# Patient Record
Sex: Male | Born: 1985 | Race: White | Hispanic: No | Marital: Married | Smoking: Never smoker
Health system: Southern US, Community
[De-identification: ages and names within clinical notes are randomized; demographics above are authoritative.]

## PROBLEM LIST (undated history)

## (undated) DIAGNOSIS — F4310 Post-traumatic stress disorder, unspecified: Secondary | ICD-10-CM

## (undated) DIAGNOSIS — U071 COVID-19: Secondary | ICD-10-CM

## (undated) DIAGNOSIS — T387X5A Adverse effect of androgens and anabolic congeners, initial encounter: Secondary | ICD-10-CM

## (undated) DIAGNOSIS — Z87442 Personal history of urinary calculi: Secondary | ICD-10-CM

## (undated) DIAGNOSIS — F909 Attention-deficit hyperactivity disorder, unspecified type: Secondary | ICD-10-CM

## (undated) DIAGNOSIS — K219 Gastro-esophageal reflux disease without esophagitis: Secondary | ICD-10-CM

## (undated) DIAGNOSIS — I493 Ventricular premature depolarization: Secondary | ICD-10-CM

## (undated) DIAGNOSIS — Z9989 Dependence on other enabling machines and devices: Secondary | ICD-10-CM

## (undated) DIAGNOSIS — F418 Other specified anxiety disorders: Secondary | ICD-10-CM

## (undated) DIAGNOSIS — G4733 Obstructive sleep apnea (adult) (pediatric): Secondary | ICD-10-CM

## (undated) HISTORY — DX: Post-traumatic stress disorder, unspecified: F43.10

## (undated) HISTORY — DX: Adverse effect of androgens and anabolic congeners, initial encounter: T38.7X5A

## (undated) HISTORY — DX: Obstructive sleep apnea (adult) (pediatric): G47.33

## (undated) HISTORY — PX: SHOULDER SURGERY: SHX246

## (undated) HISTORY — DX: Personal history of urinary calculi: Z87.442

## (undated) HISTORY — DX: Gastro-esophageal reflux disease without esophagitis: K21.9

## (undated) HISTORY — DX: Other specified anxiety disorders: F41.8

## (undated) HISTORY — DX: Ventricular premature depolarization: I49.3

## (undated) HISTORY — DX: COVID-19: U07.1

## (undated) HISTORY — DX: Attention-deficit hyperactivity disorder, unspecified type: F90.9

## (undated) HISTORY — DX: Dependence on other enabling machines and devices: Z99.89

---
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
## 2005-04-15 ENCOUNTER — Emergency Department (HOSPITAL_COMMUNITY): Admission: EM | Admit: 2005-04-15 | Discharge: 2005-04-16 | Payer: Self-pay | Admitting: Emergency Medicine

## 2010-04-04 ENCOUNTER — Encounter: Payer: Self-pay | Admitting: Orthopedic Surgery

## 2013-05-08 DIAGNOSIS — F900 Attention-deficit hyperactivity disorder, predominantly inattentive type: Secondary | ICD-10-CM | POA: Insufficient documentation

## 2013-06-20 ENCOUNTER — Emergency Department (HOSPITAL_BASED_OUTPATIENT_CLINIC_OR_DEPARTMENT_OTHER): Payer: Managed Care, Other (non HMO)

## 2013-06-20 ENCOUNTER — Emergency Department (HOSPITAL_BASED_OUTPATIENT_CLINIC_OR_DEPARTMENT_OTHER)
Admission: EM | Admit: 2013-06-20 | Discharge: 2013-06-20 | Disposition: A | Discharge status: Home / Self Care | Payer: Managed Care, Other (non HMO) | Attending: Emergency Medicine | Admitting: Emergency Medicine

## 2013-06-20 ENCOUNTER — Encounter (HOSPITAL_BASED_OUTPATIENT_CLINIC_OR_DEPARTMENT_OTHER): Payer: Self-pay | Admitting: Emergency Medicine

## 2013-06-20 DIAGNOSIS — S6721XA Crushing injury of right hand, initial encounter: Secondary | ICD-10-CM

## 2013-06-20 DIAGNOSIS — Y929 Unspecified place or not applicable: Secondary | ICD-10-CM | POA: Insufficient documentation

## 2013-06-20 DIAGNOSIS — IMO0002 Reserved for concepts with insufficient information to code with codable children: Secondary | ICD-10-CM | POA: Insufficient documentation

## 2013-06-20 DIAGNOSIS — Y939 Activity, unspecified: Secondary | ICD-10-CM | POA: Insufficient documentation

## 2013-06-20 DIAGNOSIS — W208XXA Other cause of strike by thrown, projected or falling object, initial encounter: Secondary | ICD-10-CM | POA: Insufficient documentation

## 2013-06-20 DIAGNOSIS — S6720XA Crushing injury of unspecified hand, initial encounter: Secondary | ICD-10-CM | POA: Insufficient documentation

## 2013-06-20 NOTE — ED Provider Notes (Signed)
CSN: [id number]     Arrival date & time 06/20/13  1734 History   First MD Initiated Contact with Patient 06/20/13 1921     Chief Complaint  Patient presents with  . Hand Injury     (Consider location/radiation/quality/duration/timing/severity/associated sxs/prior Treatment) Patient is a 28 y.o. male presenting with hand injury. The history is provided by the patient. No language interpreter was used.  Hand Injury Location:  Hand Injury: yes   Mechanism of injury: crush   Crush injury:    Mechanism: weight. Hand location:  L hand Pain details:    Quality:  Aching   Severity:  No pain   Onset quality:  Gradual   Timing:  Constant   Progression:  Worsening Chronicity:  New Handedness:  Left-handed Dislocation: no   Foreign body present:  No foreign bodies Tetanus status:  Up to date Relieved by:  Nothing Worsened by:  Nothing tried Ineffective treatments:  None tried Associated symptoms: swelling     History reviewed. No pertinent past medical history. History reviewed. No pertinent past surgical history. No family history on file. History  Substance Use Topics  . Smoking status: Never Smoker   . Smokeless tobacco: Not on file  . Alcohol Use: No    Review of Systems  All other systems reviewed and are negative.     Allergies  Review of patient's allergies indicates no known allergies.  Home Medications  No current outpatient prescriptions on file. BP 156/64  Pulse 63  Temp(Src) 98.2 F (36.8 C) (Oral)  Resp 18  Ht 6' (1.829 m)  Wt 223 lb (101.152 kg)  BMI 30.24 kg/m2  SpO2 99% Physical Exam  Nursing note and vitals reviewed. Constitutional: He is oriented to person, place, and time. He appears well-developed and well-nourished.  HENT:  Head: Normocephalic and atraumatic.  Musculoskeletal: He exhibits tenderness.  Bruised swollen right hand,  Small abrasion between 4th adn 5th finger   From  nv and ns intact  Neurological: He is alert and oriented to  person, place, and time. He has normal reflexes.  Skin: Skin is warm.  Psychiatric: He has a normal mood and affect.    ED Course  Procedures (including critical care time) Labs Review Labs Reviewed - No data to display Imaging Review Dg Hand Complete Left  06/20/2013   CLINICAL DATA:  Injured left hand.  EXAM: LEFT HAND - COMPLETE 3+ VIEW  COMPARISON:  None.  FINDINGS: The joint spaces are maintained. No acute fracture. Moderate soft tissue swelling noted along the dorsum of the metacarpal heads.  IMPRESSION: Dorsal soft tissue swelling but no acute fracture.   Electronically Signed   By: Loralie ChampagneMark  Gallerani M.D.   On: 06/20/2013 18:17     EKG Interpretation None      MDM   Final diagnoses:  Crushing injury of hand, right    Ibuprofen Ace wrap    Elson AreasLeslie K Nykeem Citro, PA-C 06/20/13 2058

## 2013-06-20 NOTE — ED Notes (Signed)
Hand cleansed with normal saline, small cute noted to anterior 1st metatarshal joint, hand wrapped with ace, ice reapplied

## 2013-06-20 NOTE — ED Provider Notes (Signed)
Medical screening examination/treatment/procedure(s) were performed by non-physician practitioner and as supervising physician I was immediately available for consultation/collaboration.   EKG Interpretation None        Dagmar HaitWilliam Malicia Blasdel, MD 06/20/13 2252

## 2013-06-20 NOTE — ED Notes (Signed)
Hand injury. Swelling and bruising to his left hand. States his friend dropped a plate on his hand. Small laceration between his 4th and 5th digits.

## 2013-06-20 NOTE — Discharge Instructions (Signed)
Crush Injury, Fingers or Toes  A crush injury to the fingers or toes means the tissues have been damaged by being squeezed (compressed). There will be bleeding into the tissues and swelling. Often, blood will collect under the skin. When this happens, the skin on the finger often dies and may slough off (shed) 1 week to 10 days later. Usually, new skin is growing underneath. If the injury has been too severe and the tissue does not survive, the damaged tissue may begin to turn black over several days.   Wounds which occur because of the crushing may be stitched (sutured) shut. However, crush injuries are more likely to become infected than other injuries. These wounds may not be closed as tightly as other types of cuts to prevent infection. Nails involved are often lost. These usually grow back over several weeks.   DIAGNOSIS  X-rays may be taken to see if there is any injury to the bones.  TREATMENT  Broken bones (fractures) may be treated with splinting, depending on the fracture. Often, no treatment is required for fractures of the last bone in the fingers or toes.  HOME CARE INSTRUCTIONS   · The crushed part should be raised (elevated) above the heart or center of the chest as much as possible for the first several days or as directed. This helps with pain and lessens swelling. Less swelling increases the chances that the crushed part will survive.  · Put ice on the injured area.  · Put ice in a plastic bag.  · Place a towel between your skin and the bag.  · Leave the ice on for 15-20 minutes, 03-04 times a day for the first 2 days.  · Only take over-the-counter or prescription medicines for pain, discomfort, or fever as directed by your caregiver.  · Use your injured part only as directed.  · Change your bandages (dressings) as directed.  · Keep all follow-up appointments as directed by your caregiver. Not keeping your appointment could result in a chronic or permanent injury, pain, and disability. If there is  any problem keeping the appointment, you must call to reschedule.  SEEK IMMEDIATE MEDICAL CARE IF:   · There is redness, swelling, or increasing pain in the wound area.  · Pus is coming from the wound.  · You have a fever.  · You notice a bad smell coming from the wound or dressing.  · The edges of the wound do not stay together after the sutures have been removed.  · You are unable to move the injured finger or toe.  MAKE SURE YOU:   · Understand these instructions.  · Will watch your condition.  · Will get help right away if you are not doing well or get worse.  Document Released: 02/28/2005 Document Revised: 05/23/2011 Document Reviewed: 07/16/2010  [employer]® Patient Information ©2014 [employer].

## 2014-08-18 DIAGNOSIS — G4733 Obstructive sleep apnea (adult) (pediatric): Secondary | ICD-10-CM | POA: Insufficient documentation

## 2014-10-09 ENCOUNTER — Emergency Department (HOSPITAL_COMMUNITY)
Admission: EM | Admit: 2014-10-09 | Discharge: 2014-10-09 | Disposition: A | Discharge status: Home / Self Care | Payer: Managed Care, Other (non HMO) | Attending: Emergency Medicine | Admitting: Emergency Medicine

## 2014-10-09 ENCOUNTER — Encounter (HOSPITAL_COMMUNITY): Payer: Self-pay | Admitting: Emergency Medicine

## 2014-10-09 DIAGNOSIS — R1013 Epigastric pain: Secondary | ICD-10-CM | POA: Diagnosis present

## 2014-10-09 DIAGNOSIS — K2970 Gastritis, unspecified, without bleeding: Secondary | ICD-10-CM | POA: Diagnosis not present

## 2014-10-09 LAB — CBC WITH DIFFERENTIAL/PLATELET
BASOS ABS: 0 10*3/uL (ref 0.0–0.1)
Basophils Relative: 1 % (ref 0–1)
EOS PCT: 4 % (ref 0–5)
Eosinophils Absolute: 0.2 10*3/uL (ref 0.0–0.7)
HCT: 39.7 % (ref 39.0–52.0)
Hemoglobin: 13.9 g/dL (ref 13.0–17.0)
LYMPHS PCT: 38 % (ref 12–46)
Lymphs Abs: 2.2 10*3/uL (ref 0.7–4.0)
MCH: 30.1 pg (ref 26.0–34.0)
MCHC: 35 g/dL (ref 30.0–36.0)
MCV: 85.9 fL (ref 78.0–100.0)
MONO ABS: 0.4 10*3/uL (ref 0.1–1.0)
Monocytes Relative: 6 % (ref 3–12)
Neutro Abs: 2.9 10*3/uL (ref 1.7–7.7)
Neutrophils Relative %: 51 % (ref 43–77)
Platelets: 190 10*3/uL (ref 150–400)
RBC: 4.62 MIL/uL (ref 4.22–5.81)
RDW: 12.8 % (ref 11.5–15.5)
WBC: 5.8 10*3/uL (ref 4.0–10.5)

## 2014-10-09 LAB — LIPASE, BLOOD: LIPASE: 28 U/L (ref 22–51)

## 2014-10-09 LAB — COMPREHENSIVE METABOLIC PANEL
ALK PHOS: 62 U/L (ref 38–126)
ALT: 18 U/L (ref 17–63)
ANION GAP: 6 (ref 5–15)
AST: 25 U/L (ref 15–41)
Albumin: 4.2 g/dL (ref 3.5–5.0)
BUN: 26 mg/dL — ABNORMAL HIGH (ref 6–20)
CO2: 27 mmol/L (ref 22–32)
Calcium: 9.3 mg/dL (ref 8.9–10.3)
Chloride: 105 mmol/L (ref 101–111)
Creatinine, Ser: 0.84 mg/dL (ref 0.61–1.24)
Glucose, Bld: 106 mg/dL — ABNORMAL HIGH (ref 65–99)
Potassium: 3.9 mmol/L (ref 3.5–5.1)
Sodium: 138 mmol/L (ref 135–145)
Total Bilirubin: 0.6 mg/dL (ref 0.3–1.2)
Total Protein: 7.2 g/dL (ref 6.5–8.1)

## 2014-10-09 MED ORDER — MORPHINE SULFATE 4 MG/ML IJ SOLN
4.0000 mg | Freq: Once | INTRAMUSCULAR | Status: AC
  Administered 2014-10-09: 4 mg via INTRAVENOUS
  Filled 2014-10-09: qty 1

## 2014-10-09 MED ORDER — ONDANSETRON HCL 4 MG/2ML IJ SOLN
4.0000 mg | Freq: Once | INTRAMUSCULAR | Status: AC
  Administered 2014-10-09: 4 mg via INTRAVENOUS
  Filled 2014-10-09: qty 2

## 2014-10-09 MED ORDER — GI COCKTAIL ~~LOC~~
30.0000 mL | Freq: Once | ORAL | Status: AC
  Administered 2014-10-09: 30 mL via ORAL
  Filled 2014-10-09: qty 30

## 2014-10-09 MED ORDER — PANTOPRAZOLE SODIUM 40 MG IV SOLR
40.0000 mg | INTRAVENOUS | Status: AC
  Administered 2014-10-09: 40 mg via INTRAVENOUS
  Filled 2014-10-09: qty 40

## 2014-10-09 MED ORDER — PANTOPRAZOLE SODIUM 20 MG PO TBEC
20.0000 mg | DELAYED_RELEASE_TABLET | Freq: Every day | ORAL | Status: DC
Start: 2014-10-09 — End: 2017-08-24

## 2014-10-09 NOTE — ED Notes (Signed)
MD at bedside. 

## 2014-10-09 NOTE — Discharge Instructions (Signed)
Take protonix daily for GERD. Refer to attached documents for more information. Follow up with [facility] for further evaluation and management of your symptoms.

## 2014-10-09 NOTE — ED Provider Notes (Signed)
Complains of epigastric pain onset March 2016. Pain is sometimes worse with eating. Also worse with stress. Nonradiating. He treated himself with times without relief. No fever. Last bowel movement yesterday, normal. Denies blood per rectum. On exam alert Glasgow Coma Score 15 abdomen normal active bowel sounds tender at epigastrium no guarding rigidity or rebound. No right upper quadrant tenderness no right lower quadrant tenderness. Suspect gastritis versus peptic ulcer disease versus GERD  Doug Sou, MD 10/09/14 1601

## 2014-10-09 NOTE — ED Notes (Signed)
Patient states that he has abdominal pain since 10-09-14 but this is an ongoing pain since March 2016.  He states that it has become worse over time. He was told by doctor in March that he could have acid reflux but he wasn't prescribed medications.

## 2014-10-09 NOTE — ED Provider Notes (Signed)
CSN: [id number]     Arrival date & time 10/09/14  0714 History   First MD Initiated Contact with Patient 10/09/14 [phone]     Chief Complaint  Patient presents with  . Abdominal Pain     (Consider location/radiation/quality/duration/timing/severity/associated sxs/prior Treatment) HPI Comments: Patient is a 29 year old male with no significant past medical history who presents with abdominal pain for the past 4 months that acutely worsened since last night. The pain is located in the epigastrium and does radiate. The pain is described as burning and severe. The pain started gradually and progressively worsened since the onset. No alleviating/aggravating factors. The patient has tried TUMS for symptoms without relief. Associated symptoms include decreased appetite. Patient denies fever, headache, NVD, chest pain, SOB, dysuria, constipation.    History reviewed. No pertinent past medical history. History reviewed. No pertinent past surgical history. History reviewed. No pertinent family history. History  Substance Use Topics  . Smoking status: Never Smoker   . Smokeless tobacco: Not on file  . Alcohol Use: No    Review of Systems  Gastrointestinal: Positive for abdominal pain.  All other systems reviewed and are negative.     Allergies  Review of patient's allergies indicates no known allergies.  Home Medications   Prior to Admission medications   Not on File   BP 133/76 mmHg  Pulse 70  Temp(Src) 97.7 F (36.5 C) (Oral)  Resp 20  SpO2 99% Physical Exam  Constitutional: He is oriented to person, place, and time. He appears well-developed and well-nourished. No distress.  HENT:  Head: Normocephalic and atraumatic.  Eyes: Conjunctivae and EOM are normal.  Neck: Normal range of motion.  Cardiovascular: Normal rate and regular rhythm.  Exam reveals no gallop and no friction rub.   No murmur heard. Pulmonary/Chest: Effort normal and breath sounds normal. He has no wheezes. He  has no rales. He exhibits no tenderness.  Abdominal: Soft. He exhibits no distension. There is tenderness. There is no rebound.  Epigastric tenderness to palpation. No other focal tenderness.   Musculoskeletal: Normal range of motion.  Neurological: He is alert and oriented to person, place, and time. Coordination normal.  Speech is goal-oriented. Moves limbs without ataxia.   Skin: Skin is warm and dry.  Psychiatric: He has a normal mood and affect. His behavior is normal.  Nursing note and vitals reviewed.   ED Course  Procedures (including critical care time) Labs Review Labs Reviewed  COMPREHENSIVE METABOLIC PANEL - Abnormal; Notable for the following:    Glucose, Bld 106 (*)    BUN 26 (*)    All other components within normal limits  CBC WITH DIFFERENTIAL/PLATELET  LIPASE, BLOOD    Imaging Review No results found.   EKG Interpretation None      MDM   Final diagnoses:  Gastritis    8:11 AM Labs pending. Patient will have protonix, GI cocktail, and zofran for symptoms. Vitals stable and patient afebrile.   10:16 AM Labs unremarkable for acute changes. Patient will be treated with Protonix and referral to GI. Patient likely has a gastric ulcer. Vitals stable and patient afebrile. No further evaluation needed at this time.     Emilia Beck, PA-C 10/09/14 1019  Doug Sou, MD 10/09/14 [phone]

## 2014-12-17 DIAGNOSIS — F321 Major depressive disorder, single episode, moderate: Secondary | ICD-10-CM | POA: Insufficient documentation

## 2014-12-17 DIAGNOSIS — F4310 Post-traumatic stress disorder, unspecified: Secondary | ICD-10-CM | POA: Insufficient documentation

## 2017-03-17 DIAGNOSIS — K219 Gastro-esophageal reflux disease without esophagitis: Secondary | ICD-10-CM | POA: Insufficient documentation

## 2017-04-17 DIAGNOSIS — R002 Palpitations: Secondary | ICD-10-CM | POA: Insufficient documentation

## 2017-08-22 ENCOUNTER — Encounter (HOSPITAL_COMMUNITY): Payer: Self-pay

## 2017-08-22 ENCOUNTER — Emergency Department (HOSPITAL_COMMUNITY)
Admission: EM | Admit: 2017-08-22 | Discharge: 2017-08-22 | Disposition: A | Discharge status: Home / Self Care | Payer: 59 | Attending: Emergency Medicine | Admitting: Emergency Medicine

## 2017-08-22 ENCOUNTER — Other Ambulatory Visit: Payer: Self-pay

## 2017-08-22 ENCOUNTER — Emergency Department (HOSPITAL_COMMUNITY): Payer: 59

## 2017-08-22 DIAGNOSIS — Z5321 Procedure and treatment not carried out due to patient leaving prior to being seen by health care provider: Secondary | ICD-10-CM | POA: Diagnosis not present

## 2017-08-22 DIAGNOSIS — R002 Palpitations: Secondary | ICD-10-CM | POA: Insufficient documentation

## 2017-08-22 LAB — I-STAT TROPONIN, ED: Troponin i, poc: 0.01 ng/mL (ref 0.00–0.08)

## 2017-08-22 LAB — CBC
HEMATOCRIT: 42.1 % (ref 39.0–52.0)
Hemoglobin: 14.9 g/dL (ref 13.0–17.0)
MCH: 30.2 pg (ref 26.0–34.0)
MCHC: 35.4 g/dL (ref 30.0–36.0)
MCV: 85.4 fL (ref 78.0–100.0)
Platelets: 234 10*3/uL (ref 150–400)
RBC: 4.93 MIL/uL (ref 4.22–5.81)
RDW: 11.6 % (ref 11.5–15.5)
WBC: 10.3 10*3/uL (ref 4.0–10.5)

## 2017-08-22 LAB — BASIC METABOLIC PANEL
ANION GAP: 10 (ref 5–15)
BUN: 13 mg/dL (ref 6–20)
CO2: 25 mmol/L (ref 22–32)
Calcium: 9.6 mg/dL (ref 8.9–10.3)
Chloride: 104 mmol/L (ref 101–111)
Creatinine, Ser: 0.83 mg/dL (ref 0.61–1.24)
GFR calc Af Amer: >60 mL/min (ref >60)
GFR calc non Af Amer: >60 mL/min (ref >60)
GLUCOSE: 89 mg/dL (ref 65–99)
POTASSIUM: 3.6 mmol/L (ref 3.5–5.1)
SODIUM: 139 mmol/L (ref 135–145)

## 2017-08-22 NOTE — ED Notes (Signed)
Pt decided to leave 

## 2017-08-22 NOTE — ED Provider Notes (Cosign Needed)
Patient placed in Quick Look pathway, seen and evaluated   Chief Complaint: Palpitations, central chest pain  HPI:   Palpitations intermittent for 6 months.  Remote history of anabolic steroid use.  Denies stimulants, coffee.  Reports central chest pain for about 1 day.  No SOB.  Not using CPAP at night.   ROS: no fever.   Physical Exam:   Gen: No distress  Neuro: Awake and Alert  Skin: Warm    Focused Exam: HR is irregular.  No diaphoresis.     Initiation of care has begun. The patient has been counseled on the process, plan, and necessity for staying for the completion/evaluation, and the remainder of the medical screening examination    Cristina GongHammond, Janett Kamath W, Cordelia Poche-C 08/22/17 1406

## 2017-08-22 NOTE — ED Triage Notes (Signed)
Pt endorses intermittent palpitations worse with physical activity or caffeine consumption. No medical hx. Pt has been seen by pcp for same and had ekg which was normal.

## 2017-08-23 ENCOUNTER — Encounter (HOSPITAL_COMMUNITY): Payer: Self-pay | Admitting: Emergency Medicine

## 2017-08-23 ENCOUNTER — Emergency Department (HOSPITAL_COMMUNITY)
Admission: EM | Admit: 2017-08-23 | Discharge: 2017-08-23 | Disposition: A | Discharge status: Home / Self Care | Payer: 59 | Attending: Emergency Medicine | Admitting: Emergency Medicine

## 2017-08-23 DIAGNOSIS — Z79899 Other long term (current) drug therapy: Secondary | ICD-10-CM | POA: Insufficient documentation

## 2017-08-23 DIAGNOSIS — I493 Ventricular premature depolarization: Secondary | ICD-10-CM | POA: Diagnosis not present

## 2017-08-23 DIAGNOSIS — R079 Chest pain, unspecified: Secondary | ICD-10-CM | POA: Diagnosis present

## 2017-08-23 LAB — BASIC METABOLIC PANEL
ANION GAP: 9 (ref 5–15)
BUN: 19 mg/dL (ref 6–20)
CHLORIDE: 105 mmol/L (ref 101–111)
CO2: 27 mmol/L (ref 22–32)
Calcium: 9.5 mg/dL (ref 8.9–10.3)
Creatinine, Ser: 0.85 mg/dL (ref 0.61–1.24)
Glucose, Bld: 96 mg/dL (ref 65–99)
POTASSIUM: 3.6 mmol/L (ref 3.5–5.1)
SODIUM: 141 mmol/L (ref 135–145)

## 2017-08-23 LAB — I-STAT TROPONIN, ED
TROPONIN I, POC: 0 ng/mL (ref 0.00–0.08)
Troponin i, poc: 0 ng/mL (ref 0.00–0.08)

## 2017-08-23 LAB — CBC
HEMATOCRIT: 45.4 % (ref 39.0–52.0)
HEMOGLOBIN: 16 g/dL (ref 13.0–17.0)
MCH: 31.1 pg (ref 26.0–34.0)
MCHC: 35.2 g/dL (ref 30.0–36.0)
MCV: 88.3 fL (ref 78.0–100.0)
Platelets: 263 10*3/uL (ref 150–400)
RBC: 5.14 MIL/uL (ref 4.22–5.81)
RDW: 11.9 % (ref 11.5–15.5)
WBC: 10.2 10*3/uL (ref 4.0–10.5)

## 2017-08-23 MED ORDER — METOPROLOL SUCCINATE ER 25 MG PO TB24: 25.0000 mg | ORAL_TABLET | Freq: Every day | ORAL | 0 refills | Status: DC

## 2017-08-23 NOTE — ED Provider Notes (Signed)
[name]   CSN: [id number] Arrival date & time: 08/23/17  0247     History   Chief Complaint Chief Complaint  Patient presents with  . Chest Pain    HPI Gregory Wall is a 32 y.o. male.  Chief complaint is irregular heartbeat  HPI: 32 year old male.  Started having irregular heartbeats around the end of last year.  States that one point a "heart nursing "felt his pulse and felt he was having PVCs.  This has happened intermittently.    He feels like his heart is "shifting" in his chest.  This happens intermittently but at times happens several times per minute.  When it happens it keeps him from sleep.  It happens during activity at times.  He does not have exertional syncope, chest pain, or dyspnea.  No swelling.  No CHF symptoms.  States that he is worried and concerned about it stating that in the past he used to be a "almost Sports coach".  And did anabolic steroids in high doses for a series of years.  He has since researched it and found that it can "have some effects on my heart".  History reviewed. No pertinent past medical history.  There are no active problems to display for this patient.   History reviewed. No pertinent surgical history.      Home Medications    Prior to Admission medications   Medication Sig Start Date End Date Taking? Authorizing Provider  metoprolol succinate (TOPROL-XL) 25 MG 24 hr tablet Take 1 tablet (25 mg total) by mouth daily. 08/23/17   Rolland Porter, MD  omeprazole (PRILOSEC) 40 MG capsule Take 40 mg by mouth daily. 07/04/17   [provider]  pantoprazole (PROTONIX) 20 MG tablet Take 1 tablet (20 mg total) by mouth daily. Patient not taking: Reported on 08/22/2017 10/09/14   Emilia Beck, PA-C    Family History No family history on file.  Social History Social History   Tobacco Use  . Smoking status: Never Smoker  Substance Use Topics  . Alcohol  use: No  . Drug use: Yes    Types: Marijuana    Comment: occ weed     Allergies   Patient has no known allergies.   Review of Systems Review of Systems  Constitutional: Negative for appetite change, chills, diaphoresis, fatigue and fever.  HENT: Negative for mouth sores, sore throat and trouble swallowing.   Eyes: Negative for visual disturbance.  Respiratory: Negative for cough, chest tightness, shortness of breath and wheezing.   Cardiovascular: Positive for palpitations. Negative for chest pain.  Gastrointestinal: Negative for abdominal distention, abdominal pain, diarrhea, nausea and vomiting.  Endocrine: Negative for polydipsia, polyphagia and polyuria.  Genitourinary: Negative for dysuria, frequency and hematuria.  Musculoskeletal: Negative for gait problem.  Skin: Negative for color change, pallor and rash.  Neurological: Negative for dizziness, syncope, light-headedness and headaches.  Hematological: Does not bruise/bleed easily.  Psychiatric/Behavioral: Negative for behavioral problems and confusion.     Physical Exam Updated Vital Signs BP (!) 155/103   Pulse 66   Temp 98.1 F (36.7 C) (Oral)   Resp 16   Ht 6\' 2"  (1.88 m)   Wt 106.6 kg (235 lb)   SpO2 99%   BMI 30.17 kg/m   Physical Exam  Constitutional: He is oriented to person, place, and time. He appears well-developed and well-nourished. No distress.  HENT:  Head: Normocephalic.  Eyes: Pupils are equal, round, and reactive  to light. Conjunctivae are normal. No scleral icterus.  Neck: Normal range of motion. Neck supple. No thyromegaly present.  Cardiovascular: Normal rate and regular rhythm. Exam reveals no gallop and no friction rub.  No murmur heard. Sinus rhythm.  Does have occasional PVCs as I examined him.  These do reproduce his symptoms.  No runs of tachycardia.  Does have a sinus arrhythmia.  No murmurs.  No peripheral edema  Pulmonary/Chest: Effort normal and breath sounds normal. No  respiratory distress. He has no wheezes. He has no rales.  Abdominal: Soft. Bowel sounds are normal. He exhibits no distension. There is no tenderness. There is no rebound.  Musculoskeletal: Normal range of motion.  Neurological: He is alert and oriented to person, place, and time.  Skin: Skin is warm and dry. No rash noted.  Psychiatric: He has a normal mood and affect. His behavior is normal.     ED Treatments / Results  Labs (all labs ordered are listed, but only abnormal results are displayed) Labs Reviewed  BASIC METABOLIC PANEL  CBC  I-STAT TROPONIN, ED  I-STAT TROPONIN, ED    EKG EKG Interpretation  Date/Time:  Wednesday August 23 2017 02:56:43 EDT Ventricular Rate:  73 PR Interval:  164 QRS Duration: 94 QT Interval:  398 QTC Calculation: 438 R Axis:   56 Text Interpretation:  Normal sinus rhythm with sinus arrhythmia Normal ECG Confirmed by Rolland PorterJames, Thamara Leger ([id number]) on 08/23/2017 9:22:34 AM   Radiology Dg Chest 2 View  Result Date: 08/22/2017 CLINICAL DATA:  Heart palpitations and center chest pain/discomfort over the past 8 months. Pt states he occasionally becomes SOB during strong palpitations. Hx of asthma. Nonsmoker. EXAM: CHEST - 2 VIEW COMPARISON:  None. FINDINGS: The heart size and mediastinal contours are within normal limits. Both lungs are clear. The visualized skeletal structures are unremarkable. IMPRESSION: No active cardiopulmonary disease. Electronically Signed   By: Gaylyn RongWalter  Liebkemann M.D.   On: 08/22/2017 15:08    Procedures Procedures (including critical care time)  Medications Ordered in ED Medications - No data to display   Initial Impression / Assessment and Plan / ED Course  I have reviewed the triage vital signs and the nursing notes.  Pertinent labs & imaging results that were available during my care of the patient were reviewed by me and considered in my medical decision making (see chart for details).    EKG  Interpretation  Date/Time:  Wednesday August 23 2017 02:56:43 EDT Ventricular Rate:  73 PR Interval:  164 QRS Duration: 94 QT Interval:  398 QTC Calculation: 438 R Axis:   56 Text Interpretation:  Normal sinus rhythm with sinus arrhythmia Normal ECG Confirmed by Rolland PorterJames, Devlyn Retter ([id number]) on 08/23/2017 9:22:34 AM   Reassuring labs.  Does not have voltage criteria for LVH.  Does not have cardiomegaly on chest x-ray.  Without murmurs and without enlargement of his heart very likely will have a structurally normal heart.  However, with his history of asthmatic steroid use will refer to cardiology.  May require echocardiogram, and Holter monitoring.  He is symptomatic with his PVCs and has difficulty sleeping.  We will give him low-dose 25 mg Toprol to take every afternoon to help decrease his number of symptoms and hopefully improve his quality of sleep and function until cardiology follow-up.  Meanwhile avoid decongestants, sinus medications, alcohol, tobacco, caffeine.   Final Clinical Impressions(s) / ED Diagnoses   Final diagnoses:  PVC's (premature ventricular contractions)    ED Discharge Orders  Ordered    metoprolol succinate (TOPROL-XL) 25 MG 24 hr tablet  Daily     08/23/17 0954       Rolland Porter, MD 08/23/17 1001

## 2017-08-23 NOTE — Discharge Instructions (Addendum)
Take Toprol (Beta blocker to reduce number of irregular beats) every evening. Avoid alcohol, caffeine, tobacco, or sinus meds/decongestants.  Call Dr. Cardiology office to make out patient appointment.

## 2017-08-23 NOTE — ED Triage Notes (Signed)
Pt was awaken w/ chest palpitations again tonight, was seen for the same yesterday.  Is suppose to make cards appointment but no appointment has been made yet.  Pt also feeling nauseas at this time.

## 2017-08-24 ENCOUNTER — Encounter: Payer: Self-pay | Admitting: Cardiology

## 2017-08-24 ENCOUNTER — Ambulatory Visit: Payer: 59 | Admitting: Cardiology

## 2017-08-24 VITALS — BP 113/75 | HR 69 | Ht 71.0 in | Wt 233.4 lb

## 2017-08-24 DIAGNOSIS — R002 Palpitations: Secondary | ICD-10-CM | POA: Diagnosis not present

## 2017-08-24 DIAGNOSIS — I493 Ventricular premature depolarization: Secondary | ICD-10-CM | POA: Insufficient documentation

## 2017-08-24 NOTE — Patient Instructions (Addendum)
No medication changes at present   Schedule at [facility] Your physician has recommended that you wear an event monitor - 14 DAYS. Event monitors are medical devices that record the heart's electrical activity. Doctors most often us these monitors to diagnose arrhythmias. Arrhythmias are problems with the speed or rhythm of the heartbeat. The monitor is a small, portable device. You can wear one while you do your normal daily activities. This is usually used to diagnose what is causing palpitations/syncope (passing out).    Your physician recommends that you schedule a follow-up appointment in 1 -2 MONTHS WITH DR HARDING.

## 2017-08-24 NOTE — Progress Notes (Signed)
PCP: Rosemary HolmsMoreira, Niall A, PA-C  [facility]) Betsey AmenMatthew E Levy, MD (Endocrinology)   Clinic Note: Chief Complaint  Patient presents with  . New Patient (Initial Visit)    Frequent palpitations    HPI: Gregory Wall is a 32 y.o. male who is being seen today for the Hospital follow-up evaluation of Palpitations.  Initial request from  Rosemary HolmsMoreira, Niall A, PA-C, however he has had a emergency room visit since the initial PCP visit.  Gregory Wall was last seen on 04/17/17 by Misty StanleyNiall Moreira, PA-C [facility]) -- "Patient reports that he experienced a week straight of increased amounts of palpitations. Patient states that his energy level has decreased during the week of symptoms. Patient reports that he checks his pulse during episodes and notes that he feels his heart skipping a beat every 2-14 beats'.Still able to perform CV exercise routinely with no issues. Patient notes increased symptoms after changing positions from standing to laying down. Patient denies any lightheadedness or syncope. Notes an increased "pressure" in his head occasionally."  [facility] Hospital[facility]   [facility]: Irregular Heartbeats (was told by a RN acquaintance that he was having PVCs. ->  Was noted to have PVCs on telemetry. -->  Recommended starting low-dose Toprol 25 mg in the afternoon.  Studies Personally Reviewed - (if available, images/films reviewed: From Epic Chart or Care Everywhere)   Interval History: Gregory Wall presents here today for frequent irregular heartbeats.  Basically since February has been noticing increasing episodes where he feels just frequent irregular heartbeats.  He will have associated chest discomfort were relatively jolting sensation across his chest lasting a few seconds.  This can happen off and on every 2 to 5 minutes for an hour at a time.  He can sometimes go a week without having an episode and then can go to 3 days where they  are happening all the time.  He says that the most the time they occur at night and it woke him up from sleep would like to in the morning on occasion, but they can also occur when he is out and about doing things.  He is not necessarily associated with it at all with exertion. He used to be an avid coffee drinker but, but has cut down significantly since the onset of the symptoms to down to 1 cup of coffee a day and really has not noticed that much of a difference.  besides feeling a little discomfort in his chest and some disc uneasy feeling, he occasionally will feel lightheaded but has not had any syncope or near syncopal type symptoms. Interestingly, he is a former heavy weightlifter, and acknowledges that he used anabolic steroids and T3 supplementation for several years in is early 6020s.  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  No palpitations.  Remainder of cardiac review of symptoms are essentially negative: No chest pain or shortness of breath with exertion. No PND, orthopnea or edema.  No syncope/near syncope, or TIA/amaurosis fugax symptoms. No claudication.  ROS: A comprehensive was performed. Review of Systems  Constitutional: Negative for malaise/fatigue and weight loss.  HENT: Negative for congestion and nosebleeds.   Respiratory: Negative for shortness of breath.   Gastrointestinal: Negative for blood in stool, heartburn and melena.  Genitourinary: Negative for hematuria.  Musculoskeletal: Negative.   Psychiatric/Behavioral: Negative.   All other systems reviewed  and are negative.   I have reviewed and (if needed) personally updated the patient's problem list, medications, allergies, past medical and surgical history, social and family history.   Past Medical History:  Diagnosis Date  . Adult ADHD   . Adverse effect of anabolic steroid    Longtime use of anabolic steroids in early 20s    . Depression with anxiety   . GERD (gastroesophageal reflux disease)   . History of nephrolithiasis   . OSA on CPAP   . 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 SHOULDER SURGERY     abscess    Current Meds  Medication Sig  . b complex vitamins tablet Take 1 tablet by mouth daily.  . metoprolol succinate (TOPROL-XL) 25 MG 24 hr tablet Take 1 tablet (25 mg total) by mouth daily.  . Omega-3 Fatty Acids (FISH OIL PO) Take by mouth daily.  Marland Kitchen omeprazole (PRILOSEC) 40 MG capsule Take 40 mg by mouth daily.  . [DISCONTINUED] pantoprazole (PROTONIX) 20 MG tablet Take 1 tablet (20 mg total) by mouth daily.  Toprol 25 mg daily.  Prilosec 40 mg daily.  No Known Allergies  Social History   Tobacco Use  . Smoking status: Never Smoker  . Smokeless tobacco: Never Used  Substance Use Topics  . Alcohol use: Yes    Alcohol/week: 2.4 oz    Types: 2 Glasses of wine, 2 Cans of beer per week  . Drug use: Yes    Types: Marijuana    Comment: occ weed   Social History   Social History Narrative  . Not on file    family history includes Atrial fibrillation in his maternal grandmother; CVA in his maternal grandmother and maternal uncle; Heart attack in his paternal aunt and paternal grandfather; Heart attack (age of onset: 61) in his maternal grandfather; Hernia in his mother; Hypertension in his father; Stroke in his maternal grandfather. - Afib, CVA  Wt Readings from Last 3 Encounters:  08/24/17 233 lb 6.4 oz (105.9 kg)  08/23/17 235 lb (106.6 kg)  08/22/17 235 lb (106.6 kg)    PHYSICAL EXAM BP 113/75   Pulse 69   Ht 5\' 11"  (1.803 m)   Wt 233 lb 6.4 oz (105.9 kg)   BMI 32.55 kg/m  Physical Exam  Constitutional: He is oriented to person, place, and time. He appears well-developed and well-nourished. No distress.   Healthy-appearing.  Well-groomed  HENT:  Head: Normocephalic and atraumatic.  Mouth/Throat: No oropharyngeal exudate.  Eyes: Pupils are equal, round, and reactive to light. Conjunctivae are normal. No scleral icterus.  Neck: Normal range of motion. Neck supple. No hepatojugular reflux and no JVD present. Carotid bruit is not present. No thyromegaly present.  Cardiovascular: Normal rate, regular rhythm, normal heart sounds, intact distal pulses and normal pulses.  No extrasystoles are present. PMI is not displaced. Exam reveals no gallop and no friction rub.  No murmur heard. Pulmonary/Chest: Effort normal and breath sounds normal. No respiratory distress. He has no wheezes. He has no rales.  Abdominal: Soft. Bowel sounds are normal. He exhibits no distension. There is no tenderness. There is no rebound.  No HSM  Musculoskeletal: Normal range of motion. He exhibits no edema.  Lymphadenopathy:    He has no cervical adenopathy.  Neurological: He is alert and oriented to person, place, and time.  No cranial nerve deficit.  Skin: Skin is warm and dry. No rash noted. No erythema.  Psychiatric: He has a normal mood and affect. His behavior is normal. Judgment and thought content normal.  Vitals reviewed.     Adult ECG Report -from June 11 ER visit Sinus rhythm, rate 69.  Also sinus arrhythmia.  But otherwise normal EKG.  Another EKG did have PVCs noted.  Other studies Reviewed: Additional studies/ records that were reviewed today include:  Recent Labs:    (Care Everywhere) "cholesterol still a bit high - but coming down notably since stopping steroids"  Jan 2019: TC 187, TG 58, HDL 47, LDL 128.  (Previously LDL had been 156); TSH 1.59.  BUN/creatinine 17/0.5.  Potassium 4.3.  Normal LFTs.  ASSESSMENT / PLAN: Problem List Items Addressed This Visit    Intermittent palpitations - Primary    Significant episodes of palpitations that are occurring with enough frequency that probably does not  need a month-long monitor, but not enough to simply do a 1 or 2-day monitor. Plan: 14-day event monitor. For the first week I would like for him to be not taking the beta-blocker, but  I would like him to start the beta-blocker (Toprol 25 mg daily) for the second week.  Depending on the percentage of PVCs and symptoms, we may consider follow-up echocardiogram, but for now we will simply monitor.      Relevant Orders   CARDIAC EVENT MONITOR       I spent a total of 30 minutes with the patient and chart review. >  50% of the time was spent in direct patient consultation.   Current medicines are reviewed at length with the patient today.  (+/- concerns) = has not yet started metoprolol ordered in ER.  Does not know when to start. The following changes have been made:  See below  Patient Instructions  No medication changes at present     Schedule at [facility] [address] Your physician has recommended that you wear an event monitor - 14 DAYS. Event monitors are medical devices that record the heart's electrical activity. Doctors most often [place] these monitors to diagnose arrhythmias. Arrhythmias are problems with the speed or rhythm of the heartbeat. The monitor is a small, portable device. You can wear one while you do your normal daily activities. This is usually used to diagnose what is causing palpitations/syncope (passing out).    Your physician recommends that you schedule a follow-up appointment in 1 -2 MONTHS WITH DR Cowen Pesqueira.     Studies Ordered:   Orders Placed This Encounter  Procedures  . CARDIAC EVENT MONITOR      Bryan Lemma, M.D., M.S. Interventional Cardiologist   Pager # [phone] Phone # [phone] [address] [place], [place] [address]   Thank you for choosing Heartcare at  [facility]!!

## 2017-08-25 ENCOUNTER — Encounter: Payer: Self-pay | Admitting: Cardiology

## 2017-08-25 NOTE — Assessment & Plan Note (Signed)
Significant episodes of palpitations that are occurring with enough frequency that probably does not need a month-long monitor, but not enough to simply do a 1 or 2-day monitor. Plan: 14-day event monitor. For the first week I would like for him to be not taking the beta-blocker, but  I would like him to start the beta-blocker (Toprol 25 mg daily) for the second week.  Depending on the percentage of PVCs and symptoms, we may consider follow-up echocardiogram, but for now we will simply monitor.

## 2017-09-04 ENCOUNTER — Ambulatory Visit (INDEPENDENT_AMBULATORY_CARE_PROVIDER_SITE_OTHER): Payer: 59

## 2017-09-04 DIAGNOSIS — R002 Palpitations: Secondary | ICD-10-CM

## 2017-10-13 ENCOUNTER — Ambulatory Visit: Payer: 59 | Admitting: Cardiology

## 2017-10-13 ENCOUNTER — Encounter: Payer: Self-pay | Admitting: Cardiology

## 2017-10-13 VITALS — BP 130/87 | HR 74 | Ht 71.0 in | Wt 238.4 lb

## 2017-10-13 DIAGNOSIS — I493 Ventricular premature depolarization: Secondary | ICD-10-CM

## 2017-10-13 MED ORDER — METOPROLOL SUCCINATE ER 25 MG PO TB24: 25.0000 mg | ORAL_TABLET | Freq: Every day | ORAL | 3 refills | Status: DC

## 2017-10-13 NOTE — Patient Instructions (Signed)
Your physician wants you to follow-up in: 6 months with Dr. Herbie BaltimoreHarding. You will receive a reminder letter in the mail two months in advance. If you don't receive a letter, please call our office to schedule the follow-up appointment.  Please continue metoprolol succinate 25mg  daily. You can take additional tablet daily as needed.

## 2017-10-13 NOTE — Progress Notes (Signed)
PCP: Gregory Wall  [facility]) Gregory Amen, MD (Endocrinology)  Clinic Note: Chief Complaint  Patient presents with  . Follow-up    Palpitations monitor result -     HPI: Gregory Wall is a 32 y.o. male who is being seen today for the Hospital follow-up evaluation of Palpitations.   Initial request from  Gregory Wall, however he has had a emergency room visit since the initial PCP visit. -- "Patient reports that he experienced a week straight of increased amounts of palpitations. Patient states that his energy level has decreased during the week of symptoms. Patient reports that he checks his pulse during episodes and notes that he feels his heart skipping a beat every 2-14 beats'.Still able to perform CV exercise routinely with no issues. Patient notes increased symptoms after changing positions from standing to laying down. Patient denies any lightheadedness or syncope. Notes an increased "pressure" in his head occasionally." [facility] ER 08/22/2017: Irregular Heartbeats (was told by a RN acquaintance that he was having PVCs. ->  Was noted to have PVCs on telemetry. -->  Recommended starting low-dose Toprol 25 mg in the afternoon.  Gregory Wall was seen in initial consultation on June 13.  We evaluated with a cardiac event monitor noted below.  Recent Hospitalizations:   None since initial visit  Studies Personally Reviewed - (if available, images/films reviewed: From Epic Chart or Care Everywhere)  CARDIAC EVENT MONITOR 08/2017: NORMAL Monitor - Rare (~4% PVCs, minimal PACs) premature beats & NO abnormal rhythms.  Interval History: Gregory Wall presents here today to discuss his monitor results.  He notes that in general his palpitations have improved & he definitely has figured out several triggers such as spicy foods, caffeine, lack of sleep, etc.  He does not notice these episodes during exercise, but may notice during intercourse.  No  chest pain or dyspnea.  No dizziness, lightheadedness, syncope or near syncope.   Denies any TIA or amaurosis fugax symptoms.  No resting or exertional chest pain/dyspnea.  No PND, orthopnea or edema.  No claudication.    He basically having adjusted his diet and cutting down caffeine, his symptoms have abated.  He does note that the beta-blocker has made better.  ROS: A comprehensive was performed. Review of Systems  Constitutional: Negative for malaise/fatigue and weight loss.  HENT: Negative for congestion and nosebleeds.   Respiratory: Negative for shortness of breath.   Gastrointestinal: Negative for blood in stool, heartburn and melena.  Genitourinary: Negative for hematuria.  Musculoskeletal: Negative.   Neurological: Negative for dizziness and weakness.  Psychiatric/Behavioral: Negative.   All other systems reviewed and are negative.   I have reviewed and (if needed) personally updated the patient's problem list, medications, allergies, past medical and surgical history, social and family history.   Past Medical History:  Diagnosis Date  . Adult ADHD   . Adverse effect of anabolic steroid    Longtime use of anabolic steroids in early 20s  . Depression with anxiety   . GERD (gastroesophageal reflux disease)   . History of nephrolithiasis   . OSA on CPAP   . 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 SHOULDER SURGERY     abscess    Current Meds  Medication Sig  . b complex vitamins tablet Take 1  tablet by mouth daily.  . metoprolol succinate (TOPROL-XL) 25 MG 24 hr tablet Take 1 tablet (25 mg total) by mouth daily. Take additional tablet daily as needed for palpitations/PVCs  . Omega-3 Fatty Acids (FISH OIL PO) Take by mouth daily.  Marland Kitchen. omeprazole (PRILOSEC) 40 MG capsule Take 40 mg by mouth daily.  Marland Kitchen. OVER THE COUNTER  MEDICATION as directed. Vacopa for ADD  . [DISCONTINUED] metoprolol succinate (TOPROL-XL) 25 MG 24 hr tablet Take 1 tablet (25 mg total) by mouth daily.    No Known Allergies  Social History   Tobacco Use  . Smoking status: Never Smoker  . Smokeless tobacco: Never Used  Substance Use Topics  . Alcohol use: Yes    Alcohol/week: 2.4 oz    Types: 2 Glasses of wine, 2 Cans of beer per week  . Drug use: Yes    Types: Marijuana    Comment: occ weed   Social History   Social History Narrative  . Not on file    family history includes Atrial fibrillation in his maternal grandmother; CVA in his maternal grandmother and maternal uncle; Heart attack in his paternal aunt and paternal grandfather; Heart attack (age of onset: 7665) in his maternal grandfather; Hernia in his mother; Hypertension in his father; Stroke in his maternal grandfather. - Afib, CVA  Wt Readings from Last 3 Encounters:  10/13/17 238 lb 6.4 oz (108.1 kg)  08/24/17 233 lb 6.4 oz (105.9 kg)  08/23/17 235 lb (106.6 kg)    PHYSICAL EXAM BP 130/87   Pulse 74   Ht 5\' 11"  (1.803 m)   Wt 238 lb 6.4 oz (108.1 kg)   BMI 33.25 kg/m  Physical Exam  Constitutional: He is oriented to person, place, and time. He appears well-developed and well-nourished. No distress.  Healthy-appearing.  Well-groomed  HENT:  Head: Normocephalic and atraumatic.  Mouth/Throat: No oropharyngeal exudate.  Eyes: Pupils are equal, round, and reactive to light. Conjunctivae are normal. No scleral icterus.  Neck: Normal range of motion. Neck supple. No hepatojugular reflux and no JVD present. Carotid bruit is not present.  Cardiovascular: Normal rate, regular rhythm, normal heart sounds, intact distal pulses and normal pulses.  No extrasystoles (None) are present. PMI is not displaced. Exam reveals no gallop and no friction rub.  No murmur heard. Pulmonary/Chest: Effort normal and breath sounds normal. No respiratory distress. He has no wheezes. He  has no rales.  Musculoskeletal: Normal range of motion. He exhibits no edema.  Neurological: He is alert and oriented to person, place, and time. No cranial nerve deficit.  Psychiatric: He has a normal mood and affect. His behavior is normal. Judgment and thought content normal.  Vitals reviewed.     Adult ECG Report -from June 11 ER visit Sinus rhythm, rate 69.  Also sinus arrhythmia.  But otherwise normal EKG.  Another EKG did have PVCs noted.  Other studies Reviewed: Additional studies/ records that were reviewed today include:  Recent Labs:    No new l abs since last visit  (Care Everywhere) "cholesterol still a bit high - but coming down notably since stopping steroids"  Jan 2019: TC 187, TG 58, HDL 47, LDL 128.  (Previously LDL had been 156); TSH 1.59.  BUN/creatinine 17/0.5.  Potassium 4.3.  Normal LFTs.  ASSESSMENT / PLAN: Problem List Items Addressed This Visit    Symptomatic PVCs - Primary (Chronic)    He does have significant symptoms do not have a significant amount of PVCs on monitor.  Symptoms are improved with Toprol and adjusting his diet.  He mentions triggers.  Recently discussed avoiding triggers.  Also discussed adequate hydration since he notes them after exercise. Plan: Continue Toprol  20 mg daily with ability to take an extra dose or half dose as needed for bad spells.  As long as he is not having any of these palpitation episodes with exertion, I think we are okay holding off doing an ischemic evaluation.      Relevant Medications   metoprolol succinate (TOPROL-XL) 25 MG 24 hr tablet      I spent a total of 15-20 minutes with the patient and chart review. >  50% of the time was spent in direct patient consultation.   Current medicines are reviewed at length with the patient today.  (+/- concerns) =  Symptoms better on Toprol  the following changes have been made:  See below  Patient Instructions  Your physician wants you to follow-up in: 6 months with  Dr. Herbie Baltimore. You will receive a reminder letter in the mail two months in advance. If you don't receive a letter, please call our office to schedule the follow-up appointment.  Please continue metoprolol succinate 25mg  daily. You can take additional tablet daily as needed.      Studies Ordered:   No orders of the defined types were placed in this encounter.     Bryan Lemma, M.D., M.S. Interventional Cardiologist   Pager # [phone] Phone # [phone] [address] [place], [place] [address]   Thank you for choosing Heartcare at [facility]!!

## 2017-10-13 NOTE — Assessment & Plan Note (Signed)
He does have significant symptoms do not have a significant amount of PVCs on monitor.  Symptoms are improved with Toprol and adjusting his diet.  He mentions triggers.  Recently discussed avoiding triggers.  Also discussed adequate hydration since he notes them after exercise. Plan: Continue Toprol  20 mg daily with ability to take an extra dose or half dose as needed for bad spells.  As long as he is not having any of these palpitation episodes with exertion, I think we are okay holding off doing an ischemic evaluation.

## 2017-11-15 ENCOUNTER — Encounter: Payer: Self-pay | Admitting: *Deleted

## 2018-03-27 ENCOUNTER — Other Ambulatory Visit: Payer: Self-pay

## 2018-03-27 ENCOUNTER — Encounter (HOSPITAL_COMMUNITY): Payer: Self-pay | Admitting: Emergency Medicine

## 2018-03-27 ENCOUNTER — Emergency Department (HOSPITAL_COMMUNITY)
Admission: EM | Admit: 2018-03-27 | Discharge: 2018-03-27 | Disposition: A | Discharge status: Home / Self Care | Payer: 59 | Attending: Emergency Medicine | Admitting: Emergency Medicine

## 2018-03-27 ENCOUNTER — Emergency Department (HOSPITAL_COMMUNITY): Payer: 59

## 2018-03-27 DIAGNOSIS — F909 Attention-deficit hyperactivity disorder, unspecified type: Secondary | ICD-10-CM | POA: Diagnosis not present

## 2018-03-27 DIAGNOSIS — R0789 Other chest pain: Secondary | ICD-10-CM | POA: Diagnosis not present

## 2018-03-27 DIAGNOSIS — Z79899 Other long term (current) drug therapy: Secondary | ICD-10-CM | POA: Diagnosis not present

## 2018-03-27 LAB — BASIC METABOLIC PANEL
Anion gap: 8 (ref 5–15)
BUN: 13 mg/dL (ref 6–20)
CALCIUM: 9.5 mg/dL (ref 8.9–10.3)
CO2: 25 mmol/L (ref 22–32)
Chloride: 107 mmol/L (ref 98–111)
Creatinine, Ser: 0.83 mg/dL (ref 0.61–1.24)
GFR calc Af Amer: >60 mL/min (ref >60)
GLUCOSE: 99 mg/dL (ref 70–99)
POTASSIUM: 4 mmol/L (ref 3.5–5.1)
SODIUM: 140 mmol/L (ref 135–145)

## 2018-03-27 LAB — CBC
HCT: 43.2 % (ref 39.0–52.0)
HEMOGLOBIN: 14.7 g/dL (ref 13.0–17.0)
MCH: 29.6 pg (ref 26.0–34.0)
MCHC: 34 g/dL (ref 30.0–36.0)
MCV: 86.9 fL (ref 80.0–100.0)
PLATELETS: 213 10*3/uL (ref 150–400)
RBC: 4.97 MIL/uL (ref 4.22–5.81)
RDW: 11.5 % (ref 11.5–15.5)
WBC: 7.2 10*3/uL (ref 4.0–10.5)
nRBC: 0 % (ref 0.0–0.2)

## 2018-03-27 LAB — I-STAT TROPONIN, ED: TROPONIN I, POC: 0 ng/mL (ref 0.00–0.08)

## 2018-03-27 NOTE — ED Triage Notes (Signed)
Pt presents with chest pain to the center of chest, nausea, and scapula pain. He reports these symptoms are consistent with what's been going on, has an appointment Dr. Herbie Baltimore in the morning.  Has been out of his metoprolol since Nov. Hx of aggressive PVC

## 2018-03-27 NOTE — ED Provider Notes (Signed)
[facility] EMERGENCY DEPARTMENT Provider Note   CSN: [id number] Arrival date & time: 03/27/18  1121     History   Chief Complaint Chief Complaint  Patient presents with  . Chest Pain    HPI Ulices Maack is a 33 y.o. male.  33 year old male patient with prior medical history as detailed below presents for evaluation of recurrent chest discomfort.  Patient has previously been seen by cardiology for same and similar complaint.  He has been diagnosed with symptomatic PVCs.  Of note, patient has a follow-up appointment with Dr. Herbie Baltimore of cardiology tomorrow.  Patient reports that over the weekend he had worsening symptoms of palpitations and vague chest discomfort.  This was persistent through the course of Saturday and Sunday.  He arrives today for reassurance that there is no critical illness present.  Patient without current chest pain or shortness of breath.  He denies associated fever, nausea, vomiting, diaphoresis, or other acute complaint.  The history is provided by the patient and medical records.  Chest Pain  Chest pain location: Diffuse anterior chest wall. Pain quality: aching   Pain radiates to:  Does not radiate Pain severity:  No pain Onset quality:  Gradual Duration: Longstanding. Timing:  Intermittent Progression:  Waxing and waning Chronicity:  New Relieved by:  Nothing Worsened by:  Nothing Ineffective treatments:  None tried   Past Medical History:  Diagnosis Date  . Adult ADHD   . Adverse effect of anabolic steroid    Longtime use of anabolic steroids in early 20s  . Depression with anxiety   . GERD (gastroesophageal reflux disease)   . History of nephrolithiasis   . OSA on CPAP   . PTSD (post-traumatic stress disorder)     Patient Active Problem List   Diagnosis Date Noted  . 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 SHOULDER SURGERY     abscess        Home Medications     Prior to Admission medications   Medication Sig Start Date End Date Taking? Authorizing Provider  b complex vitamins tablet Take 1 tablet by mouth daily.    [provider]  metoprolol succinate (TOPROL-XL) 25 MG 24 hr tablet Take 1 tablet (25 mg total) by mouth daily. Take additional tablet daily as needed for palpitations/PVCs 10/13/17   Marykay Lex, MD  Omega-3 Fatty Acids (FISH OIL PO) Take by mouth daily.    [provider]  omeprazole (PRILOSEC) 40 MG capsule Take 40 mg by mouth daily. 07/04/17   [provider]  OVER THE COUNTER MEDICATION as directed. Bacopa for ADD    [provider]    Family History Family History  Problem Relation Age of Onset  . Hernia Mother        severaL sGX  . Hypertension Father   . Atrial fibrillation Maternal Grandmother        s/p PPM  . CVA Maternal Grandmother   . Stroke Maternal Grandfather   . Heart attack Maternal Grandfather 65  . Heart attack Paternal Grandfather        pacemaker  . Heart attack Paternal Aunt        late 11s - early 10s  . CVA Maternal Uncle     Social History Social History   Tobacco Use  . Smoking status: Never Smoker  . Smokeless tobacco: Never Used  Substance Use Topics  . Alcohol use: Yes  Alcohol/week: 4.0 standard drinks    Types: 2 Glasses of wine, 2 Cans of beer per week  . Drug use: Yes    Types: Marijuana    Comment: occ weed     Allergies   Patient has no known allergies.   Review of Systems Review of Systems  Cardiovascular: Positive for chest pain.  All other systems reviewed and are negative.    Physical Exam Updated Vital Signs BP (!) 130/99 (BP Location: Left Arm)   Pulse 70   Temp 98.6 F (37 C) (Oral)   Resp 15   Ht 6' (1.829 m)   Wt 107 kg   SpO2 99%   BMI 32.01 kg/m   Physical Exam Vitals signs and nursing note reviewed.  Constitutional:      General: He is not in acute distress.    Appearance: He is well-developed.   HENT:     Head: Normocephalic and atraumatic.  Eyes:     Conjunctiva/sclera: Conjunctivae normal.     Pupils: Pupils are equal, round, and reactive to light.  Neck:     Musculoskeletal: Normal range of motion and neck supple.  Cardiovascular:     Rate and Rhythm: Normal rate and regular rhythm.     Heart sounds: Normal heart sounds.  Pulmonary:     Effort: Pulmonary effort is normal. No respiratory distress.     Breath sounds: Normal breath sounds.  Abdominal:     General: There is no distension.     Palpations: Abdomen is soft.     Tenderness: There is no abdominal tenderness.  Musculoskeletal: Normal range of motion.        General: No deformity.  Skin:    General: Skin is warm and dry.  Neurological:     Mental Status: He is alert and oriented to person, place, and time.      ED Treatments / Results  Labs (all labs ordered are listed, but only abnormal results are displayed) Labs Reviewed  BASIC METABOLIC PANEL  CBC  I-STAT TROPONIN, ED    EKG EKG Interpretation  Date/Time:  Tuesday March 27 2018 11:29:14 EST Ventricular Rate:  75 PR Interval:  154 QRS Duration: 86 QT Interval:  376 QTC Calculation: 419 R Axis:   71 Text Interpretation:  Normal sinus rhythm Cannot rule out Anterior infarct , age undetermined Abnormal ECG Confirmed by Kristine RoyalMessick, Mannie Wineland [phone]([id number]) on 03/27/2018 12:21:49 PM   Radiology Dg Chest 2 View  Result Date: 03/27/2018 CLINICAL DATA:  Central chest pain. EXAM: CHEST - 2 VIEW COMPARISON:  08/22/2017 FINDINGS: Normal heart size and mediastinal contours. No acute infiltrate or edema. No effusion or pneumothorax. Mild upper thoracic levocurvature. No acute osseous findings. Artifact from EKG leads. IMPRESSION: No evidence of active disease. Electronically Signed   By: Marnee SpringJonathon  Watts M.D.   On: 03/27/2018 11:59    Procedures Procedures (including critical care time)  Medications Ordered in ED Medications - No data to display   Initial  Impression / Assessment and Plan / ED Course  I have reviewed the triage vital signs and the nursing notes.  Pertinent labs & imaging results that were available during my care of the patient were reviewed by me and considered in my medical decision making (see chart for details).     MDM  Screen complete  Patient is presenting for evaluation of atypical chest discomfort and associated palpitations.  Patient has had prior work-up with cardiology.  Patient has follow-up appointment tomorrow with cardiology.  EKG  performed today is without evidence of acute ischemia.  Troponin x1 is negative.  Patient's reported symptoms were worse approximately 4 days prior.  He is currently symptom-free.  Other screening labs are without significant abnormality.  Patient appears to be appropriate for discharge.  He does understand need for close follow-up with cardiology. I also suggested that he follow-up with both his primary care doctor and potentially with gastroenterology.  Strict return precautions given and understood.  Importance of close follow-up was stressed.   Final Clinical Impressions(s) / ED Diagnoses   Final diagnoses:  Atypical chest pain    ED Discharge Orders    None       Wynetta FinesMessick, Cass Edinger C, MD 03/27/18 1330

## 2018-03-27 NOTE — Discharge Instructions (Addendum)
Return for any problem.  Follow-up with cardiology as previously scheduled for tomorrow.  You may also follow-up with gastroenterology as instructed.

## 2018-03-28 ENCOUNTER — Encounter: Payer: Self-pay | Admitting: Cardiology

## 2018-03-28 ENCOUNTER — Ambulatory Visit: Payer: 59 | Admitting: Cardiology

## 2018-03-28 VITALS — BP 116/78 | HR 70 | Ht 71.5 in | Wt 237.2 lb

## 2018-03-28 DIAGNOSIS — K219 Gastro-esophageal reflux disease without esophagitis: Secondary | ICD-10-CM | POA: Diagnosis not present

## 2018-03-28 DIAGNOSIS — R1011 Right upper quadrant pain: Secondary | ICD-10-CM | POA: Diagnosis not present

## 2018-03-28 DIAGNOSIS — R0789 Other chest pain: Secondary | ICD-10-CM | POA: Diagnosis not present

## 2018-03-28 DIAGNOSIS — I493 Ventricular premature depolarization: Secondary | ICD-10-CM | POA: Diagnosis not present

## 2018-03-28 MED ORDER — METOPROLOL SUCCINATE ER 25 MG PO TB24: 25.0000 mg | ORAL_TABLET | Freq: Every day | ORAL | 3 refills | Status: DC

## 2018-03-28 MED ORDER — PANTOPRAZOLE SODIUM 40 MG PO TBEC: 40.0000 mg | DELAYED_RELEASE_TABLET | Freq: Every day | ORAL | 3 refills | Status: DC

## 2018-03-28 NOTE — Patient Instructions (Addendum)
Medication Instructions:    restart taking toprol xl ( metoprolol succinate) 25 mg one tablet daily  Stop taking Omeprazole;  Start taking PROTONIX 40 MG  One tablet daily in the morning   If you need a refill on your cardiac medications before your next appointment, please call your pharmacy.   Lab work: Not needed If you have labs (blood work) drawn today and your tests are completely normal, you will receive your results only by: Marland Kitchen MyChart Message (if you have MyChart) OR . A paper copy in the mail If you have any lab test that is abnormal or we need to change your treatment, we will call you to review the results.  Testing/Procedures: SCHEDULE AT  [address] Your physician has requested that you have an exercise tolerance test. For further information please visit [email or web address] Please also follow instruction sheet, as given. AND  SCHEDULE AT  [facility] [address] Vascular Ultrasound A vascular ultrasound is a painless test that is done to see if you have blood flow problems or clots in your blood vessels. It uses harmless sound waves to take pictures of the arteries and veins in your body. The pictures are taken by passing a device (transducer) over certain areas of your body. Tell a health care provider about:  Any allergies you have.  All medicines you are taking, including vitamins, herbs, eye drops, creams, and over-the-counter medicines.  Any blood disorders you have.  Any surgeries you have had.  Any medical conditions you have.  Whether you are pregnant or may be pregnant. What are the risks? Generally, this is a safe procedure. There are no known risks or complications that arise from having an ultrasound. What happens before the procedure?  If the ultrasound scan involves your upper abdomen, you may be told not to eat or chew gum the morning of your exam. Follow your health care provider's  instructions.  Do not smoke or use nicotine products at least 30 minutes before the exam.  During the test, a gel will be applied to your skin. What happens during the procedure?   A gel will be applied to your skin. It may feel cool.  The transducer will be placed on the area to be examined.  Pictures will be taken. They will be displayed on one or more monitors that look like small television screens. What happens after the procedure?  You can safely drive home immediately after your exam.  You may resume your normal diet and activities.  Keep all follow-up visits as told by your health care provider. This is important.  It is up to you to get your test results. Ask your health care provider, or the department that is doing the test: ? When will my results be ready? ? How will I get my results? ? What are my treatment options? ? What other tests do I need? ? What are my next steps? Summary  A vascular ultrasound is a painless test that is done to see if you have blood flow problems or clots in your blood vessels. It uses harmless sound waves to take pictures of the arteries and veins in your body.  Generally, this is a safe procedure. There are no known risks or complications that arise from having an ultrasound.  A gel will be applied to your skin. It may feel cool. The device that takes the pictures (transducer) will then be placed on the area to  be examined.  It is up to you to get your test results. Ask your health care provider or the department that is doing the test when your results will be ready and how you will get your results. This information is not intended to replace advice given to you by your health care provider. Make sure you discuss any questions you have with your health care provider. Document Released: 03/11/2004 Document Revised: 04/06/2017 Document Reviewed: 04/06/2017 Elsevier Interactive Patient Education  2019 [employer].   Follow-Up: At [facility], you and your health needs are our priority.  As part of our continuing mission to provide you with exceptional heart care, we have created designated Provider Care Teams.  These Care Teams include your primary Cardiologist (physician) and Advanced Practice Providers (APPs -  Physician Assistants and Nurse Practitioners) who all work together to provide you with the care you need, when you need it. . Your physician recommends that you schedule a follow-up appointment in 1 MONTH WITH DR HARDING .   Any Other Special Instructions Will Be Listed Below (If Applicable).

## 2018-03-28 NOTE — Progress Notes (Signed)
PCP: Rosemary HolmsMoreira, Niall A, PA-C  [facility]) Betsey AmenMatthew E Levy, MD (Endocrinology)  Clinic Note: Chief Complaint  Patient presents with  . Hospitalization Follow-up    ER last night with chest pain and palpitations  . Palpitations    He recently stopped taking Toprol.  . Chest Pain    Epigastric    HPI: Mardelle MatteRandall Kyle Armel is a 33 y.o. male who is being seen today for follow-up evaluation of palpitations/PVCs.  His initial visit was at the request of  Bryon LionsMoreira, Niall A, PA-C following an episode of palpitations in June 2019.  He was seen here twice in the summer 2019, however he was just seen in the emergency room last night.  [facility] Irregular Heartbeats (was told by a RN acquaintance that he was having PVCs. ->  Was noted to have PVCs on telemetry. -->  Recommended starting low-dose Toprol 25 mg in the afternoon.  Ernesto RutherfordRandall Kyle Loney was seen in initial consultation on June 13 --evaluated with a cardiac event monitor showing roughly 4% PVCs but no arrhythmias.  I then saw him back in August 2019, he noticed that his palpitations were improved and he was avoiding triggers such as spicy foods, caffeine and chocolate.  He did not notice episodes during exertion although occasionally did with intercourse.  No chest pain or pressure.  We started him on Toprol 25 mg but he subsequently stopped them back in November because he was feeling better, and was avoiding his triggers.  Recent Hospitalizations:   ER visit March 27, 2017 (atypical chest pain) --> he indicates that he had stopped taking his Toprol back in November and was doing fine through December, but he has been noticing more bloating discomfort sensation in his lower chest and upper abdomen..  He has been noticing worsening palpitations.  He described as a vague chest discomfort that lasted Saturday through Sunday of this past weekend. -ER visit does not mention nausea and vomiting  Studies Personally  Reviewed - (if available, images/films reviewed: From Epic Chart or Care Everywhere)  No new  Interval History: Harvie HeckRandy presents here describing episodes that seem a little bit different than what was described in the ER note.  He describes that he was at work and he started feeling just strange all over with a pressure sensation in his upper abdomen and lower chest.  He felt bloated and nauseated.  He has noted some nausea and vomiting (not mentioned in ER visit).  With that he then started noticing more palpitations than usual.  He is now having more prominent palpitations as well as this discomfort in his abdomen.  It feels better if he burps.  He described it as being just under his rib cage and radiates over to the right.  Is worse with deep inspiration.  He really has not been doing much activity, but walking around taking deep breaths makes it a little bit worse to get short of breath.   Major concern is that he is very concerned about this and says he feels like he thinks is having a heart attack. When asked little more detail it seemed of started after he ate dinner Friday night and then got worse throughout the day Saturday and Sunday and has been pretty much going on this whole week. He denies any fevers or chills.  He says he has been taking his Prilosec and has not noticed any benefit from it.  With the bloating sensation and feeling in  his burp, he is wondering if maybe his CPAP is causing problems, so for the last couple days he has been sleeping without his CPAP, and notes a little bit difference but not significant.   ROS: A comprehensive was performed.  Pertinent symptoms noted above Review of Systems  Constitutional: Positive for malaise/fatigue (Just this past week). Negative for chills, fever and weight loss.  HENT: Negative for congestion and nosebleeds.   Respiratory: Positive for shortness of breath (With his epigastric pain).   Gastrointestinal: Positive for abdominal pain,  heartburn, nausea and vomiting. Negative for blood in stool and melena.       Bloating sensation  Genitourinary: Negative for hematuria.  Musculoskeletal: Negative.   Neurological: Negative for dizziness and weakness.  Psychiatric/Behavioral: The patient is nervous/anxious (Very anxious about symptoms).   All other systems reviewed and are negative.   I have reviewed and (if needed) personally updated the patient's problem list, medications, allergies, past medical and surgical history, social and family history.   Past Medical History:  Diagnosis Date  . Adult ADHD   . Adverse effect of anabolic steroid    Longtime use of anabolic steroids in early 20s  . Depression with anxiety   . GERD (gastroesophageal reflux disease)   . History of nephrolithiasis   . OSA on CPAP   . PTSD (post-traumatic stress disorder)   . Symptomatic PVCs    Roughly 4% PVCs noted on monitor.  Controlled with beta-blocker and avoiding triggers.   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 SHOULDER SURGERY     abscess    Current Meds  Medication Sig  . b complex vitamins tablet Take 1 tablet by mouth daily.  . Omega-3 Fatty Acids (FISH OIL PO) Take by mouth daily.  Marland Kitchen OVER THE COUNTER MEDICATION as directed. Bacopa for ADD  . [DISCONTINUED] omeprazole (PRILOSEC) 40 MG capsule Take 40 mg by mouth daily.    Not on File  Social History   Tobacco Use  . Smoking status: Never Smoker  . Smokeless tobacco: Never Used  Substance Use Topics  . Alcohol use: Yes    Alcohol/week: 4.0 standard drinks    Types: 2 Glasses of wine, 2 Cans of beer per week  . Drug use: Yes    Types: Marijuana    Comment: occ weed   Social History   Social History Narrative  . Not on file    family history includes Atrial fibrillation in his maternal grandmother; CVA in his maternal grandmother and maternal  uncle; Heart attack in his paternal aunt and paternal grandfather; Heart attack (age of onset: 70) in his maternal grandfather; Hernia in his mother; Hypertension in his father; Stroke in his maternal grandfather. - Afib, CVA  Wt Readings from Last 3 Encounters:  03/28/18 237 lb 3.2 oz (107.6 kg)  03/27/18 236 lb (107 kg)  10/13/17 238 lb 6.4 oz (108.1 kg)    PHYSICAL EXAM BP 116/78   Pulse 70   Ht 5' 11.5" (1.816 m)   Wt 237 lb 3.2 oz (107.6 kg)   SpO2 97%   BMI 32.62 kg/m  Physical Exam  Constitutional: He is oriented to person, place, and time. He appears well-developed and well-nourished. No distress.  Healthy-appearing.  Well-groomed.  HENT:  Head: Normocephalic and atraumatic.  Neck: Normal range of motion. Neck supple. No hepatojugular reflux and no JVD present. Carotid  bruit is not present.  Cardiovascular: Normal rate, regular rhythm, normal heart sounds, intact distal pulses and normal pulses.  No extrasystoles (None) are present. PMI is not displaced. Exam reveals no gallop and no friction rub.  No murmur heard. Pulmonary/Chest: Effort normal and breath sounds normal. No respiratory distress. He has no wheezes. He has no rales.  Abdominal: Soft. Bowel sounds are normal. He exhibits no distension. There is abdominal tenderness (Epigastric, right upper quadrant). There is no rebound.  No HSM  Musculoskeletal: Normal range of motion.        General: No edema.  Neurological: He is alert and oriented to person, place, and time.  Psychiatric: He has a normal mood and affect. His behavior is normal. Judgment and thought content normal.  Anxious  Vitals reviewed.     Adult ECG Report -from June 11 ER visit Sinus rhythm, rate 69.  Also sinus arrhythmia.  But otherwise normal EKG.  Another EKG did have PVCs noted.  Other studies Reviewed: Additional studies/ records that were reviewed today include:  Recent Labs:    No new l abs since last visit  (Care Everywhere)  "cholesterol still a bit high - but coming down notably since stopping steroids"  Jan 2019: TC 187, TG 58, HDL 47, LDL 128.  (Previously LDL had been 156); TSH 1.59.  BUN/creatinine 17/0.5.  Potassium 4.3.  Normal LFTs.  ASSESSMENT / PLAN: Problem List Items Addressed This Visit    Atypical chest pain    Mostly epigastric pain that radiates along the right lower rib cage.  With him having PVCs, we will do a GXT to exclude ischemia, however I think is more likely to be either GERD or gallbladder disease.  Plan: Right upper quadrant ultrasound and GXT. Switch PPI from omeprazole to pantoprazole -> recommend possible GI evaluation      Relevant Orders   EXERCISE TOLERANCE TEST (ETT)   US ABDOMEN LIMITED RUQ   Gastroesophageal reflux disease    I do think a lot of his current epigastric symptoms are related to GERD.  We will switch from omeprazole to pantoprazole and check right upper quadrant ultrasound just to ensure no gallbladder disease.      Relevant Medications   pantoprazole (PROTONIX) 40 MG tablet   Other Relevant Orders   US ABDOMEN LIMITED RUQ   Right upper quadrant abdominal pain    Epigastric/lower chest pain radiating along the right lower rib into the right upper quadrant.  Tender with palpation. Plan: Right upper quadrant ultrasound to exclude hepatic or gallbladder disease.      Relevant Orders   US ABDOMEN LIMITED RUQ   Symptomatic PVCs - Primary (Chronic)    I think the symptoms that he is feeling now is probably a GI problem to begin with, but as a result he is having now triggers that are making him have his PVCs more frequently. Plan: Restart Toprol. GXT to evaluate for possible ischemic etiology given his "chest pain ".      Relevant Medications   metoprolol succinate (TOPROL-XL) 25 MG 24 hr tablet   Other Relevant Orders   EXERCISE TOLERANCE TEST (ETT)      I spent a total of 15-20 minutes with the patient and chart review. >  50% of the time was spent  in direct patient consultation.   Current medicines are reviewed at length with the patient today.  (+/- concerns) =  the following changes have been made:  See below  Patient Instructions  Medication Instructions:    restart taking toprol xl ( metoprolol succinate) 25 mg one tablet daily  Stop taking Omeprazole;  Start taking PROTONIX 40 MG  One tablet daily in the morning   If you need a refill on your cardiac medications before your next appointment, please call your pharmacy.   Lab work: Not needed If you have labs (blood work) drawn today and your tests are completely normal, you will receive your results only by: Marland Kitchen MyChart Message (if you have MyChart) OR . A paper copy in the mail If you have any lab test that is abnormal or we need to change your treatment, we will call you to review the results.  Testing/Procedures: SCHEDULE AT  [address] Your physician has requested that you have an exercise tolerance test. For further information please visit [email or web address] Please also follow instruction sheet, as given. AND  SCHEDULE AT  [facility] [address]  Follow-Up: At [facility], you and your health needs are our priority.  As part of our continuing mission to provide you with exceptional heart care, we have created designated Provider Care Teams.  These Care Teams include your primary Cardiologist (physician) and Advanced Practice Providers (APPs -  Physician Assistants and Nurse Practitioners) who all work together to provide you with the care you need, when you need it. . Your physician recommends that you schedule a follow-up appointment in 1 MONTH WITH DR Kierre Hintz .   Any Other Special Instructions Will Be Listed Below (If Applicable).       Studies Ordered:   Orders Placed This Encounter  Procedures  . US ABDOMEN LIMITED RUQ  . EXERCISE TOLERANCE TEST (ETT)      Bryan Lemma, M.D.,  M.S. Interventional Cardiologist   Pager # [phone] Phone # [phone] [address] [place], [place] [address]   Thank you for choosing Heartcare at [facility]!!

## 2018-03-31 ENCOUNTER — Encounter: Payer: Self-pay | Admitting: Cardiology

## 2018-03-31 NOTE — Assessment & Plan Note (Signed)
Epigastric/lower chest pain radiating along the right lower rib into the right upper quadrant.  Tender with palpation. Plan: Right upper quadrant ultrasound to exclude hepatic or gallbladder disease.

## 2018-03-31 NOTE — Assessment & Plan Note (Signed)
Mostly epigastric pain that radiates along the right lower rib cage.  With him having PVCs, we will do a GXT to exclude ischemia, however I think is more likely to be either GERD or gallbladder disease.  Plan: Right upper quadrant ultrasound and GXT. Switch PPI from omeprazole to pantoprazole -> recommend possible GI evaluation

## 2018-03-31 NOTE — Assessment & Plan Note (Signed)
I do think a lot of his current epigastric symptoms are related to GERD.  We will switch from omeprazole to pantoprazole and check right upper quadrant ultrasound just to ensure no gallbladder disease.

## 2018-03-31 NOTE — Assessment & Plan Note (Signed)
I think the symptoms that he is feeling now is probably a GI problem to begin with, but as a result he is having now triggers that are making him have his PVCs more frequently. Plan: Restart Toprol. GXT to evaluate for possible ischemic etiology given his "chest pain ".

## 2018-04-02 ENCOUNTER — Ambulatory Visit
Admission: RE | Admit: 2018-04-02 | Discharge: 2018-04-02 | Disposition: A | Discharge status: Home / Self Care | Payer: 59 | Source: Ambulatory Visit | Attending: Cardiology | Admitting: Cardiology

## 2018-04-02 DIAGNOSIS — R1011 Right upper quadrant pain: Secondary | ICD-10-CM

## 2018-04-02 DIAGNOSIS — R0789 Other chest pain: Secondary | ICD-10-CM

## 2018-04-02 DIAGNOSIS — K219 Gastro-esophageal reflux disease without esophagitis: Secondary | ICD-10-CM

## 2018-04-03 ENCOUNTER — Telehealth: Payer: Self-pay | Admitting: Cardiology

## 2018-04-03 NOTE — Telephone Encounter (Signed)
The patient has been notified of the result and verbalized understanding.  All questions (if any) were answered. Tobin Chad, RN 04/03/2018 12:56 PM  routed to primary

## 2018-04-03 NOTE — Telephone Encounter (Signed)
New Message           Patient is calling checking the status of his CT scan. Pls call and advise.

## 2018-04-06 ENCOUNTER — Telehealth (HOSPITAL_COMMUNITY): Payer: Self-pay

## 2018-04-06 NOTE — Telephone Encounter (Signed)
Encounter complete. 

## 2018-04-09 ENCOUNTER — Other Ambulatory Visit: Payer: Self-pay

## 2018-04-09 ENCOUNTER — Emergency Department (HOSPITAL_COMMUNITY)
Admission: EM | Admit: 2018-04-09 | Discharge: 2018-04-10 | Disposition: A | Discharge status: Home / Self Care | Payer: 59 | Attending: Emergency Medicine | Admitting: Emergency Medicine

## 2018-04-09 ENCOUNTER — Telehealth: Payer: Self-pay | Admitting: Cardiology

## 2018-04-09 ENCOUNTER — Emergency Department (HOSPITAL_COMMUNITY): Payer: 59

## 2018-04-09 ENCOUNTER — Encounter (HOSPITAL_COMMUNITY): Payer: Self-pay

## 2018-04-09 DIAGNOSIS — R112 Nausea with vomiting, unspecified: Secondary | ICD-10-CM | POA: Insufficient documentation

## 2018-04-09 DIAGNOSIS — Z79899 Other long term (current) drug therapy: Secondary | ICD-10-CM | POA: Diagnosis not present

## 2018-04-09 DIAGNOSIS — R1013 Epigastric pain: Secondary | ICD-10-CM | POA: Diagnosis not present

## 2018-04-09 LAB — COMPREHENSIVE METABOLIC PANEL
ALK PHOS: 48 U/L (ref 38–126)
ALT: 24 U/L (ref 0–44)
ANION GAP: 10 (ref 5–15)
AST: 25 U/L (ref 15–41)
Albumin: 4.4 g/dL (ref 3.5–5.0)
BILIRUBIN TOTAL: 0.8 mg/dL (ref 0.3–1.2)
BUN: 16 mg/dL (ref 6–20)
CALCIUM: 9.5 mg/dL (ref 8.9–10.3)
CO2: 22 mmol/L (ref 22–32)
CREATININE: 0.77 mg/dL (ref 0.61–1.24)
Chloride: 107 mmol/L (ref 98–111)
Glucose, Bld: 106 mg/dL — ABNORMAL HIGH (ref 70–99)
Potassium: 3.6 mmol/L (ref 3.5–5.1)
SODIUM: 139 mmol/L (ref 135–145)
TOTAL PROTEIN: 7.4 g/dL (ref 6.5–8.1)

## 2018-04-09 LAB — CBC
HCT: 43.3 % (ref 39.0–52.0)
Hemoglobin: 15.3 g/dL (ref 13.0–17.0)
MCH: 30.2 pg (ref 26.0–34.0)
MCHC: 35.3 g/dL (ref 30.0–36.0)
MCV: 85.4 fL (ref 80.0–100.0)
NRBC: 0 % (ref 0.0–0.2)
PLATELETS: 208 10*3/uL (ref 150–400)
RBC: 5.07 MIL/uL (ref 4.22–5.81)
RDW: 11.6 % (ref 11.5–15.5)
WBC: 12.2 10*3/uL — ABNORMAL HIGH (ref 4.0–10.5)

## 2018-04-09 LAB — LIPASE, BLOOD: Lipase: 48 U/L (ref 11–51)

## 2018-04-09 MED ORDER — SODIUM CHLORIDE 0.9% FLUSH: 3.0000 mL | Freq: Once | INTRAVENOUS | Status: DC

## 2018-04-09 MED ORDER — SUCRALFATE 1 G PO TABS
1.0000 g | ORAL_TABLET | Freq: Once | ORAL | Status: AC
  Administered 2018-04-09: 1 g via ORAL
  Filled 2018-04-09: qty 1

## 2018-04-09 MED ORDER — SODIUM CHLORIDE 0.9 % IV BOLUS
1000.0000 mL | Freq: Once | INTRAVENOUS | Status: AC
  Administered 2018-04-09: 1000 mL via INTRAVENOUS

## 2018-04-09 MED ORDER — PROMETHAZINE HCL 25 MG/ML IJ SOLN
12.5000 mg | Freq: Once | INTRAMUSCULAR | Status: AC
  Administered 2018-04-10: 12.5 mg via INTRAVENOUS
  Filled 2018-04-09: qty 1

## 2018-04-09 MED ORDER — FAMOTIDINE IN NACL 20-0.9 MG/50ML-% IV SOLN
20.0000 mg | Freq: Once | INTRAVENOUS | Status: AC
  Administered 2018-04-09: 20 mg via INTRAVENOUS
  Filled 2018-04-09: qty 50

## 2018-04-09 MED ORDER — ALUM & MAG HYDROXIDE-SIMETH 200-200-20 MG/5ML PO SUSP
30.0000 mL | Freq: Once | ORAL | Status: AC
  Administered 2018-04-09: 30 mL via ORAL
  Filled 2018-04-09: qty 30

## 2018-04-09 MED ORDER — DICYCLOMINE HCL 10 MG PO CAPS
10.0000 mg | ORAL_CAPSULE | Freq: Once | ORAL | Status: AC
  Administered 2018-04-09: 10 mg via ORAL
  Filled 2018-04-09: qty 1

## 2018-04-09 NOTE — Telephone Encounter (Signed)
   Dr Herbie Baltimore recently changed a medication put him on acid reflux meds. He ate some blueberries this morning and he threw up and he states he feels bad and feels like his HR is up. Patient states Dr Herbie Baltimore is aware of how he feels during an episode and would like some advice.

## 2018-04-09 NOTE — ED Provider Notes (Signed)
[facility] DEPT Provider Note   CSN: [id number] Arrival date & time: 04/09/18  1823     History   Chief Complaint Chief Complaint  Patient presents with  . Abdominal Pain    HPI Gregory Wall is a 33 y.o. male.  HPI   Pt is a 32 y/o male with a h/o adhd, depression/anxiety, gerd, nephrolithiasis, CPAP, PVCs who presents to the ED today c/o epigastric abd pain that began about 1 year ago. He states that pain worsened today.  Pain radiates to the RUQ and to the right side of the back.  He reports NV as well that has been present for at least 6 years per patient. He states this NV has worsened and he has vomited 20x today. Pt states he has been on a new diet recently but had a cheat meal yesterday and ate [place] food.  Denies constipation.  States he has had several bowel movements today but denies diarrhea.  Reports sweats and chills at home but no documented fevers.  Has a burning sensation to the chest that he states feels similar to his history of acid reflux.  Denies pain.  Reports a fluttering sensation in his chest secondary to PVCs.  Sometimes makes him feel short of breath.    He is on protonix. He was seen in the ed earlier this month for chest pain. He later f/u with cardiology who started him on Protonix and got a right upper quadrant ultrasound.  Right upper quadrant ultrasound reviewed from 04/02/2018.  There was no evidence of gallstones or other gallbladder abnormality.  Recent cardiology note, a GI source is suspected to explain his symptoms.  Plan for is for possible GI referral. He is also scheduled for an outpt stress test.    Past Medical History:  Diagnosis Date  . Adult ADHD   . Adverse effect of anabolic steroid    Longtime use of anabolic steroids in early 20s  . Depression with anxiety   . GERD (gastroesophageal reflux disease)   . History of nephrolithiasis   . OSA on CPAP   . PTSD (post-traumatic stress disorder)   .  Symptomatic PVCs    Roughly 4% PVCs noted on monitor.  Controlled with beta-blocker and avoiding triggers.    Patient Active Problem List   Diagnosis Date Noted  . Right upper quadrant abdominal pain 03/28/2018  . Atypical chest pain 03/28/2018  . Gastroesophageal reflux disease 03/28/2018  . 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 SHOULDER SURGERY     abscess        Home Medications    Prior to Admission medications   Medication Sig Start Date End Date Taking? Authorizing Provider  b complex vitamins tablet Take 1 tablet by mouth daily.   Yes [provider]  metoprolol succinate (TOPROL-XL) 25 MG 24 hr tablet Take 1 tablet (25 mg total) by mouth daily. Take additional tablet daily as needed for palpitations/PVCs 03/28/18  Yes Leonie Man, MD  Omega-3 Fatty Acids (FISH OIL PO) Take 3,000 mg by mouth daily.    Yes [provider]  OVER THE COUNTER MEDICATION as directed. Bacopa for ADD   Yes [provider]  pantoprazole (PROTONIX) 40 MG tablet Take 1 tablet (40 mg total) by mouth daily. 03/28/18  Yes Leonie Man, MD  ondansetron (ZOFRAN) 4 MG tablet Take 1 tablet (4 mg total) by mouth every 8 (eight) hours as  needed for nausea or vomiting. 04/10/18   Syris Brookens S, PA-C  sucralfate (CARAFATE) 1 g tablet Take 1 tablet (1 g total) by mouth 2 (two) times daily with a meal for 7 days. 04/10/18 04/17/18  Beaumont Austad S, PA-C    Family History Family History  Problem Relation Age of Onset  . Hernia Mother        severaL sGX  . Hypertension Father   . Atrial fibrillation Maternal Grandmother        s/p PPM  . CVA Maternal Grandmother   . Stroke Maternal Grandfather   . Heart attack Maternal Grandfather 65  . Heart attack Paternal Grandfather        pacemaker  . Heart attack Paternal Aunt        late 28s - early 63s  . CVA Maternal Uncle     Social History Social History   Tobacco Use  .  Smoking status: Never Smoker  . Smokeless tobacco: Never Used  Substance Use Topics  . Alcohol use: Yes    Alcohol/week: 4.0 standard drinks    Types: 2 Glasses of wine, 2 Cans of beer per week  . Drug use: Yes    Types: Marijuana    Comment: occ weed     Allergies   Patient has no known allergies.   Review of Systems Review of Systems  Constitutional: Positive for chills and diaphoresis. Negative for fever.  HENT: Negative for ear pain and sore throat.   Eyes: Negative for visual disturbance.  Respiratory: Positive for shortness of breath. Negative for cough.   Cardiovascular: Positive for palpitations. Negative for chest pain.  Gastrointestinal: Positive for abdominal pain, nausea and vomiting. Negative for blood in stool, constipation and diarrhea.  Genitourinary: Negative for hematuria.  Musculoskeletal: Negative for back pain.  Skin: Negative for rash.  Neurological: Negative for syncope and headaches.  All other systems reviewed and are negative.    Physical Exam Updated Vital Signs BP 119/78 (BP Location: Right Arm)   Pulse 77   Temp 99.1 F (37.3 C) (Oral)   Resp 12   Ht 5' 11.5" (1.816 m)   Wt 107 kg   SpO2 98%   BMI 32.44 kg/m   Physical Exam Vitals signs and nursing note reviewed.  Constitutional:      Appearance: He is well-developed. He is not ill-appearing or toxic-appearing.  HENT:     Head: Normocephalic and atraumatic.     Mouth/Throat:     Mouth: Mucous membranes are moist.  Eyes:     Conjunctiva/sclera: Conjunctivae normal.  Neck:     Musculoskeletal: Neck supple.  Cardiovascular:     Rate and Rhythm: Normal rate and regular rhythm.     Heart sounds: Normal heart sounds. No murmur.  Pulmonary:     Effort: Pulmonary effort is normal. No respiratory distress.     Breath sounds: Normal breath sounds. No stridor. No wheezing or rhonchi.  Abdominal:     General: Abdomen is flat.     Palpations: Abdomen is soft.     Tenderness: There is  abdominal tenderness in the right upper quadrant and epigastric area. There is no guarding or rebound.     Comments: Pt is distractable  Skin:    General: Skin is warm and dry.  Neurological:     Mental Status: He is alert.  Psychiatric:        Mood and Affect: Mood normal.      ED Treatments / Results  Labs (all labs ordered are listed, but only abnormal results are displayed) Labs Reviewed  COMPREHENSIVE METABOLIC PANEL - Abnormal; Notable for the following components:      Result Value   Glucose, Bld 106 (*)    All other components within normal limits  CBC - Abnormal; Notable for the following components:   WBC 12.2 (*)    All other components within normal limits  LIPASE, BLOOD  URINALYSIS, ROUTINE W REFLEX MICROSCOPIC  TROPONIN I    EKG EKG Interpretation  Date/Time:  Monday April 09 2018 22:22:13 EST Ventricular Rate:  93 PR Interval:    QRS Duration: 92 QT Interval:  326 QTC Calculation: 406 R Axis:   66 Text Interpretation:  Sinus arrhythmia Multiple ventricular premature complexes Nonspecific T abnrm, anterolateral leads PVCs new otherwise no change from prior Confirmed by Isla Pence [phone]) on 04/10/2018 12:07:31 AM   Radiology Dg Chest 2 View  Result Date: 04/09/2018 CLINICAL DATA:  Epigastric pain for 1 year EXAM: CHEST - 2 VIEW COMPARISON:  03/27/2018 FINDINGS: The heart size and mediastinal contours are within normal limits. Both lungs are clear. The visualized skeletal structures are unremarkable. IMPRESSION: No active cardiopulmonary disease. Electronically Signed   By: Ulyses Jarred M.D.   On: 04/09/2018 22:29    Procedures Procedures (including critical care time)  Medications Ordered in ED Medications  sodium chloride flush (NS) 0.9 % injection 3 mL (3 mLs Intravenous Not Given 04/09/18 2227)  sodium chloride 0.9 % bolus 1,000 mL (0 mLs Intravenous Stopped 04/09/18 2256)  famotidine (PEPCID) IVPB 20 mg premix (0 mg Intravenous Stopped  04/09/18 2255)  sucralfate (CARAFATE) tablet 1 g (1 g Oral Given 04/09/18 2224)  alum & mag hydroxide-simeth (MAALOX/MYLANTA) 200-200-20 MG/5ML suspension 30 mL (30 mLs Oral Given 04/09/18 2224)  dicyclomine (BENTYL) capsule 10 mg (10 mg Oral Given 04/09/18 2224)  promethazine (PHENERGAN) injection 12.5 mg (12.5 mg Intravenous Given 04/10/18 0005)     Initial Impression / Assessment and Plan / ED Course  I have reviewed the triage vital signs and the nursing notes.  Pertinent labs & imaging results that were available during my care of the patient were reviewed by me and considered in my medical decision making (see chart for details).      Final Clinical Impressions(s) / ED Diagnoses   Final diagnoses:  Non-intractable vomiting with nausea, unspecified vomiting type  Epigastric pain   Patient presenting with acute exacerbation of his epigastric abdominal pain nausea and vomiting.  He has a history of GERD and was recently started on Protonix.  States he ate [place] food last night before his symptoms worsened.  His normal home medications did not improve his symptoms.  He was recently seen by his cardiologist who felt his symptoms could be related to GI source and ordered a right upper quadrant ultrasound and suggested a referral to GI.  Right upper quadrant ultrasound and not show any evidence of gallstones or other gallbladder abnormality.  On exam today he has some mild epigastric and right upper quadrant tenderness however he is distractible.  He has no peritoneal signs.  He is not febrile and his vital signs are normal.  Cbc with mild leukocytosis. No anemia.  CMP with normal kidney and liver function. Normal bilirubin and alk phos. Normal electrolytes.  Lipase is WNL.  UA and troponin are currently pending at the time of shift change.  CXR with no acute cardiopulmonary disease.  EKG with PVCs which are new, but otherwise EKG  is unchanged.   Care signed out to Quincy Carnes, PA-C.  Plan is to follow-up on the results of the urinalysis and troponin.  If these tests are normal and patient is able to pass his p.o. challenge and remain without peritoneal signs then he is likely safe for discharge with close follow-up with his PCP and GI referral.   ED Discharge Orders         Ordered    sucralfate (CARAFATE) 1 g tablet  2 times daily with meals     04/10/18 0010    ondansetron (ZOFRAN) 4 MG tablet  Every 8 hours PRN     04/10/18 0010           Bessie Boyte S, PA-C 04/10/18 0011    Julianne Rice, MD 04/16/18 1504

## 2018-04-09 NOTE — ED Triage Notes (Signed)
Pt BIBA from home. Pt has had this issues for 1 year , worse the past 2 weeks. Pt has N/V/abd pain (epigastric). Pt was referred to GI, but has not seen md.

## 2018-04-09 NOTE — Telephone Encounter (Signed)
A TRIAGE NURSE SPOKE WITH PATIENT  EARLIER   PATIENT STATES  NAUSEA AND VOMITING SEVERAL TIMES TODAY  RECOMMENDATION DRINK PLENTY OF FLUIDS AND CAL TOMORROW IF CONTINUES   - SO MYOVIEW CAN BE POSTPONE  FROM 04/11/18 UNTIL FEELING BETER

## 2018-04-10 ENCOUNTER — Encounter: Payer: Self-pay | Admitting: Physician Assistant

## 2018-04-10 LAB — URINALYSIS, ROUTINE W REFLEX MICROSCOPIC
Bilirubin Urine: NEGATIVE
GLUCOSE, UA: NEGATIVE mg/dL
Hgb urine dipstick: NEGATIVE
Ketones, ur: 80 mg/dL — AB
Leukocytes, UA: NEGATIVE
Nitrite: NEGATIVE
PH: 6 (ref 5.0–8.0)
Protein, ur: NEGATIVE mg/dL
Specific Gravity, Urine: 1.027 (ref 1.005–1.030)

## 2018-04-10 LAB — TROPONIN I: Troponin I: <0.03 ng/mL (ref <0.03)

## 2018-04-10 MED ORDER — ONDANSETRON HCL 4 MG PO TABS: 4.0000 mg | ORAL_TABLET | Freq: Three times a day (TID) | ORAL | 0 refills | Status: DC | PRN

## 2018-04-10 MED ORDER — SUCRALFATE 1 G PO TABS: 1.0000 g | ORAL_TABLET | Freq: Two times a day (BID) | ORAL | 0 refills | Status: DC

## 2018-04-10 NOTE — Discharge Instructions (Addendum)
Please follow up with your cardiologist in regards to your continued PVCs.  You were given a referral to a gastroenterology doctor. Please call the office to schedule an appointment for follow up.   Please return to the ER sooner if you have any new or worsening symptoms, or if you have any of the following symptoms:  Abdominal pain that does not go away.  You have a fever.  You keep throwing up (vomiting).  The pain is felt only in portions of the abdomen. Pain in the right side could possibly be appendicitis. In an adult, pain in the left lower portion of the abdomen could be colitis or diverticulitis.  You pass bloody or black tarry stools.  There is bright red blood in the stool.  The constipation stays for more than 4 days.  There is belly (abdominal) or rectal pain.  You do not seem to be getting better.  You have any questions or concerns.

## 2018-04-10 NOTE — ED Provider Notes (Signed)
Assumed care from PA Couture at shift change.  See prior note for full H&P.  Briefly, 33 y.o. M here with epigastric pain, nausea, and vomiting.  Hx of GERD.  Recently seen by cardiology due to chest pain, felt to be GI in nature.  Recent RUQ ultrasound that was negative as well on 04/02/18.  Labs overall reassuring.  Given IVF, symptomatic care.  Plan:  Awaiting UA and troponin.  If negative and vomiting controlled, can d/c home.  Results for orders placed or performed during the hospital encounter of 04/09/18  Lipase, blood  Result Value Ref Range   Lipase 48 11 - 51 U/L  Comprehensive metabolic panel  Result Value Ref Range   Sodium 139 135 - 145 mmol/L   Potassium 3.6 3.5 - 5.1 mmol/L   Chloride 107 98 - 111 mmol/L   CO2 22 22 - 32 mmol/L   Glucose, Bld 106 (H) 70 - 99 mg/dL   BUN 16 6 - 20 mg/dL   Creatinine, Ser 3.870.77 0.61 - 1.24 mg/dL   Calcium 9.5 8.9 - 56.410.3 mg/dL   Total Protein 7.4 6.5 - 8.1 g/dL   Albumin 4.4 3.5 - 5.0 g/dL   AST 25 15 - 41 U/L   ALT 24 0 - 44 U/L   Alkaline Phosphatase 48 38 - 126 U/L   Total Bilirubin 0.8 0.3 - 1.2 mg/dL   GFR calc non Af Amer >60 >60 mL/min   GFR calc Af Amer >60 >60 mL/min   Anion gap 10 5 - 15  CBC  Result Value Ref Range   WBC 12.2 (H) 4.0 - 10.5 K/uL   RBC 5.07 4.22 - 5.81 MIL/uL   Hemoglobin 15.3 13.0 - 17.0 g/dL   HCT 33.243.3 95.139.0 - 88.452.0 %   MCV 85.4 80.0 - 100.0 fL   MCH 30.2 26.0 - 34.0 pg   MCHC 35.3 30.0 - 36.0 g/dL   RDW 16.611.6 06.311.5 - 01.615.5 %   Platelets 208 150 - 400 K/uL   nRBC 0.0 0.0 - 0.2 %  Urinalysis, Routine w reflex microscopic  Result Value Ref Range   Color, Urine AMBER (A) YELLOW   APPearance CLEAR CLEAR   Specific Gravity, Urine 1.027 1.005 - 1.030   pH 6.0 5.0 - 8.0   Glucose, UA NEGATIVE NEGATIVE mg/dL   Hgb urine dipstick NEGATIVE NEGATIVE   Bilirubin Urine NEGATIVE NEGATIVE   Ketones, ur 80 (A) NEGATIVE mg/dL   Protein, ur NEGATIVE NEGATIVE mg/dL   Nitrite NEGATIVE NEGATIVE   Leukocytes, UA NEGATIVE  NEGATIVE  Troponin I - ONCE - STAT  Result Value Ref Range   Troponin I <0.03 <0.03 ng/mL   Dg Chest 2 View  Result Date: 04/09/2018 CLINICAL DATA:  Epigastric pain for 1 year EXAM: CHEST - 2 VIEW COMPARISON:  03/27/2018 FINDINGS: The heart size and mediastinal contours are within normal limits. Both lungs are clear. The visualized skeletal structures are unremarkable. IMPRESSION: No active cardiopulmonary disease. Electronically Signed   By: Deatra RobinsonKevin  Herman M.D.   On: 04/09/2018 22:29    [place] Abdomen Limited Ruq  Result Date: 04/02/2018 CLINICAL DATA:  Chest and upper abdominal pain EXAM: ULTRASOUND ABDOMEN LIMITED RIGHT UPPER QUADRANT COMPARISON:  None. FINDINGS: Gallbladder: No gallstones or wall thickening visualized. There is no pericholecystic fluid. No sonographic Murphy sign noted by sonographer. Common bile duct: Diameter: 4 mm. No intrahepatic or extrahepatic biliary duct dilatation. Liver: No focal lesion identified. Liver echogenicity overall is increased. Portal vein is patent  on color Doppler imaging with normal direction of blood flow towards the liver. IMPRESSION: Increase in liver echogenicity, a finding felt to represent a degree of hepatic steatosis. No focal liver lesions appreciated. Study otherwise unremarkable. Electronically Signed   By: Bretta Bang III M.D.   On: 04/02/2018 09:21    1:11 AM Patient feeling better after phenergan, no further emesis.  Troponin negative.  UA without signs of infection.  Plan to d/c home with symptomatic care, given GI follow-up as per PA Couture.  Recommend PCP follow-up as well.  Return here for any new/acute changes.     Garlon Hatchet, PA-C 04/10/18 [id number]    Jacalyn Lefevre, MD 04/10/18 [phone]

## 2018-04-10 NOTE — Telephone Encounter (Signed)
Patient went to ED yesterday for GI symptoms.

## 2018-04-11 ENCOUNTER — Ambulatory Visit: Payer: 59 | Admitting: Gastroenterology

## 2018-04-11 ENCOUNTER — Encounter (HOSPITAL_COMMUNITY): Payer: 59

## 2018-04-17 ENCOUNTER — Ambulatory Visit: Payer: 59 | Admitting: Physician Assistant

## 2018-04-17 ENCOUNTER — Encounter

## 2018-04-17 ENCOUNTER — Encounter: Payer: Self-pay | Admitting: Physician Assistant

## 2018-04-17 VITALS — BP 112/80 | HR 66 | Ht 71.0 in | Wt 229.0 lb

## 2018-04-17 DIAGNOSIS — R1013 Epigastric pain: Secondary | ICD-10-CM

## 2018-04-17 DIAGNOSIS — R1011 Right upper quadrant pain: Secondary | ICD-10-CM | POA: Diagnosis not present

## 2018-04-17 DIAGNOSIS — R112 Nausea with vomiting, unspecified: Secondary | ICD-10-CM

## 2018-04-17 MED ORDER — PANTOPRAZOLE SODIUM 40 MG PO TBEC: 40.0000 mg | DELAYED_RELEASE_TABLET | Freq: Two times a day (BID) | ORAL | 3 refills | Status: DC

## 2018-04-17 MED ORDER — ONDANSETRON HCL 4 MG PO TABS: ORAL_TABLET | ORAL | 2 refills | Status: DC

## 2018-04-17 NOTE — Progress Notes (Signed)
Reviewed. I agree with documentation including the assessment and plan.  Talor Cheema L. Trinton Prewitt, MD, MPH 

## 2018-04-17 NOTE — Progress Notes (Signed)
Chief Complaint: Right upper quadrant abdominal pain with nausea and vomiting  HPI:    [name] is a 33 year old Caucasian male with a past medical history as listed below including depression/anxiety, nephrolithiasis, CPAP and PVCs, who was referred to me by Bryon LionsMoreira, Niall A, PA-C for a complaint of right upper quadrant abdominal pain.      04/09/2018 seen in the ED for non-intractable vomiting.  Patient described that time he had some nausea and vomiting for 6 years but it had worsened with 20 vomiting episodes that day.  Was on Protonix.  Lipase normal and CMP normal.  CBC with a white count of 12.2.  Right upper quadrant ultrasound with increase in liver echogenicity, thought to represent hepatic steatosis, no focal liver lesions and otherwise unremarkable.  Patient improved with Phenergan in ED.  Prescribed Carafate 1 g 2 times a day and Zofran every 8 hours.    04/13/2018 seen in the ED for right upper quadrant abdominal pain.  At that time described pain is started years ago but worsened over the past 2 weeks and was in the epigastrium.  Eating and lying on the right side made this pain worse.  Did describe using marijuana, Phenergan, Zofran without improvement.  Associated nausea and vomiting.  CBC and CMP normal.  CT abdomen pelvis with no acute findings.  At that time it was recommended he have a HIDA scan for further work-up.    Today, patient begins by explaining that he used to be a Pharmacist, communitybody builder with chronic steroid use, but got out of this a few years ago and radically changed his diet telling that he may eat 8 or 9 pizzas on a monthly basis.  Over the past 6 years he has been suffering from some nausea and vomiting inconsistently, but over the past year developed a severe epigastric pain which now radiates over to his right upper quadrant and around to his back rated as an 8-9/10, somewhat relieved with marijuana and with worsened nausea and vomiting. Pain made worse by eating. Over the past  week he cannot keep "anything down at all".  Apparently was able to eat 3 grilled cheeses with "nothing on them" in the past week but even Pedialyte makes him nauseous.  Also drinks a lot of soda which he tells me helps when it makes him burp.  He has been in the ED on multiple occasions as above and even previous to this.  At one point he was told to see his cardiologist in regards to possible gallbladder related issues as he also has a diagnosis of PVCs.  They did an ultrasound as above.  Currently patient tells me he is unable to work as he will become nauseous.  He does smoke marijuana multiple times throughout the day which is the only thing that seems to help with pain.  Cannot tolerate Carafate.  Zofran helps some.  He is just ready to "get to the bottom of this".  Associated symptoms include a weight loss of around 20 pounds over the past few months.    Denies fever, chills, change in bowel habits or symptoms that awaken him from sleep.  Past Medical History:  Diagnosis Date  . Adult ADHD   . Adverse effect of anabolic steroid    Longtime use of anabolic steroids in early 20s  . Depression with anxiety   . GERD (gastroesophageal reflux disease)   . History of nephrolithiasis   . OSA on CPAP   . PTSD (  post-traumatic stress disorder)   . Symptomatic PVCs    Roughly 4% PVCs noted on monitor.  Controlled with beta-blocker and avoiding triggers.    Past Surgical History:  Procedure Laterality Date  . SHOULDER SURGERY     abscess    Current Outpatient Medications  Medication Sig Dispense Refill  . b complex vitamins tablet Take 1 tablet by mouth daily.    . metoprolol succinate (TOPROL-XL) 25 MG 24 hr tablet Take 1 tablet (25 mg total) by mouth daily. Take additional tablet daily as needed for palpitations/PVCs 135 tablet 3  . Omega-3 Fatty Acids (FISH OIL PO) Take 3,000 mg by mouth daily.     . ondansetron (ZOFRAN) 4 MG tablet Take 1 tablet (4 mg total) by mouth every 8 (eight) hours  as needed for nausea or vomiting. 6 tablet 0  . OVER THE COUNTER MEDICATION as directed. Bacopa for ADD    . pantoprazole (PROTONIX) 40 MG tablet Take 1 tablet (40 mg total) by mouth daily. 90 tablet 3  . sucralfate (CARAFATE) 1 g tablet Take 1 tablet (1 g total) by mouth 2 (two) times daily with a meal for 7 days. 14 tablet 0   No current facility-administered medications for this visit.     Allergies as of 04/17/2018  . (No Known Allergies)    Family History  Problem Relation Age of Onset  . Hernia Mother        severaL sGX  . Hypertension Father   . Atrial fibrillation Maternal Grandmother        s/p PPM  . CVA Maternal Grandmother   . Stroke Maternal Grandfather   . Heart attack Maternal Grandfather 65  . Heart attack Paternal Grandfather        pacemaker  . Heart attack Paternal Aunt        late 39s - early 47s  . CVA Maternal Uncle     Social History   Socioeconomic History  . Marital status: Single    Spouse name: Not on file  . Number of children: Not on file  . Years of education: Not on file  . Highest education level: Not on file  Occupational History  . Not on file  Social Needs  . Financial resource strain: Not on file  . Food insecurity:    Worry: Not on file    Inability: Not on file  . Transportation needs:    Medical: Not on file    Non-medical: Not on file  Tobacco Use  . Smoking status: Never Smoker  . Smokeless tobacco: Never Used  Substance and Sexual Activity  . Alcohol use: Yes    Alcohol/week: 4.0 standard drinks    Types: 2 Glasses of wine, 2 Cans of beer per week  . Drug use: Yes    Types: Marijuana    Comment: occ weed  . Sexual activity: Not on file  Lifestyle  . Physical activity:    Days per week: Not on file    Minutes per session: Not on file  . Stress: Not on file  Relationships  . Social connections:    Talks on phone: Not on file    Gets together: Not on file    Attends religious service: Not on file    Active  member of club or organization: Not on file    Attends meetings of clubs or organizations: Not on file    Relationship status: Not on file  . Intimate partner violence:  Fear of current or ex partner: Not on file    Emotionally abused: Not on file    Physically abused: Not on file    Forced sexual activity: Not on file  Other Topics Concern  . Not on file  Social History Narrative  . Not on file    Review of Systems:    Constitutional: No fever or chills Skin: No rash Cardiovascular: No chest pain Respiratory: No SOB  Gastrointestinal: See HPI and otherwise negative Genitourinary: No dysuria  Neurological: No headache, dizziness or syncope Musculoskeletal: No new muscle or joint pain Hematologic: No bleeding  Psychiatric: +depression and anxiety   Physical Exam:  Vital signs: BP 112/80   Pulse 66   Ht 5\' 11"  (1.803 m)   Wt 229 lb (103.9 kg)   BMI 31.94 kg/m   Constitutional:   Pleasant Caucasian male appears to be in NAD, Well developed, Well nourished, alert and cooperative Head:  Normocephalic and atraumatic. Eyes:   PEERL, EOMI. No icterus. Conjunctiva pink. Ears:  Normal auditory acuity. Neck:  Supple Throat: Oral cavity and pharynx without inflammation, swelling or lesion.  Respiratory: Respirations even and unlabored. Lungs clear to auscultation bilaterally.   No wheezes, crackles, or rhonchi.  Cardiovascular: Normal S1, S2. No MRG. Regular rate and rhythm. No peripheral edema, cyanosis or pallor.  Gastrointestinal:  Soft, nondistended, Marked RUQ and Moderate epigastric ttp with voluntry guarding. Normal bowel sounds. No appreciable masses or hepatomegaly. Rectal:  Not performed.  Msk:  Symmetrical without gross deformities. Without edema, no deformity or joint abnormality.  Neurologic:  Alert and  oriented x4;  grossly normal neurologically.  Skin:   Dry and intact without significant lesions or rashes. Psychiatric: Demonstrates good judgement and reason  without abnormal affect or behaviors.  MOST RECENT LABS AND IMAGING (and see HPI): CBC    Component Value Date/Time   WBC 12.2 (H) 04/09/2018 1838   RBC 5.07 04/09/2018 1838   HGB 15.3 04/09/2018 1838   HCT 43.3 04/09/2018 1838   PLT 208 04/09/2018 1838   MCV 85.4 04/09/2018 1838   MCH 30.2 04/09/2018 1838   MCHC 35.3 04/09/2018 1838   RDW 11.6 04/09/2018 1838   LYMPHSABS 2.2 10/09/2014 0831   MONOABS 0.4 10/09/2014 0831   EOSABS 0.2 10/09/2014 0831   BASOSABS 0.0 10/09/2014 0831    CMP     Component Value Date/Time   NA 139 04/09/2018 1838   K 3.6 04/09/2018 1838   CL 107 04/09/2018 1838   CO2 22 04/09/2018 1838   GLUCOSE 106 (H) 04/09/2018 1838   BUN 16 04/09/2018 1838   CREATININE 0.77 04/09/2018 1838   CALCIUM 9.5 04/09/2018 1838   PROT 7.4 04/09/2018 1838   ALBUMIN 4.4 04/09/2018 1838   AST 25 04/09/2018 1838   ALT 24 04/09/2018 1838   ALKPHOS 48 04/09/2018 1838   BILITOT 0.8 04/09/2018 1838   GFRNONAA >60 04/09/2018 1838   GFRAA >60 04/09/2018 1838    Assessment: 1.  Right upper quadrant/epigastric abdominal pain: Over the past 6 years, worse over the past few months, now with intractable nausea and vomiting regardless of what the patient eats.  Previous right upper quadrant ultrasound and recent CT abdomen pelvis normal.  Labs normal.  Daily marijuana use.  Consider PUD +/- H. pylori versus gallbladder etiology versus marijuana 2.  Nausea and vomiting: See above  Plan: 1.  Discussed with patient that he should discontinue his marijuana use as this may be leading to more problems for  him.  He does discuss that he has been using this for over half of his life but more so recently due to the pain. 2.  Increased Pantoprazole to 40 mg twice daily, 30-60 minutes before breakfast and dinner #60 with 3 refills 3.  Reviewed antireflux diet and lifestyle modifications 4.  Prescribed Zofran 4 mg ODT, 1 tab every 4-6 hours as needed for nausea #30 with 1 refill 5.   Ordered a HIDA scan with CCK for complete evaluation of gallbladder etiology. 6.  Scheduled patient for an EGD in the [facility] with Dr. Orvan FalconerBeavers as she is supervising this morning.  Did discuss risks,  benefits, limitations and alternatives and the patient agrees to proceed. 7.  Patient to follow in clinic per recommendations from Dr. Orvan FalconerBeavers after time of procedure.  Hyacinth MeekerJennifer Claxton Levitz, PA-C [facility] 04/17/2018, 10:18 AM  Cc: Bryon LionsMoreira, Niall A, PA-C

## 2018-04-17 NOTE — Patient Instructions (Signed)
We have sent the following medications to your pharmacy for you to pick up at your convenience:  Pantoprazole, Zofran  You have been scheduled for an endoscopy. Please follow written instructions given to you at your visit today. If you use inhalers (even only as needed), please bring them with you on the day of your procedure. Your physician has requested that you go to www.startemmi.com and enter the access code given to you at your visit today. This web site gives a general overview about your procedure. However, you should still follow specific instructions given to you by our office regarding your preparation for the procedure.  You have been scheduled for a HIDA scan at [facility] (1st floor) on 04/25/2018. Please arrive 15 minutes prior to your scheduled appointment at  1:44YJ. Make certain not to have anything to eat or drink after midnight prior to your test. Should this appointment date or time not work well for you, please call radiology scheduling at [phone].  _____________________________________________________________________ hepatobiliary (HIDA) scan is an imaging procedure used to diagnose problems in the liver, gallbladder and bile ducts. In the HIDA scan, a radioactive chemical or tracer is injected into a vein in your arm. The tracer is handled by the liver like bile. Bile is a fluid produced and excreted by your liver that helps your digestive system break down fats in the foods you eat. Bile is stored in your gallbladder and the gallbladder releases the bile when you eat a meal. A special nuclear medicine scanner (gamma camera) tracks the flow of the tracer from your liver into your gallbladder and small intestine.  During your HIDA scan  You'll be asked to change into a hospital gown before your HIDA scan begins. Your health care team will position you on a table, usually on your back. The radioactive tracer is then injected into a vein in your arm.The tracer travels  through your bloodstream to your liver, where it's taken up by the bile-producing cells. The radioactive tracer travels with the bile from your liver into your gallbladder and through your bile ducts to your small intestine.You may feel some pressure while the radioactive tracer is injected into your vein. As you lie on the table, a special gamma camera is positioned over your abdomen taking pictures of the tracer as it moves through your body. The gamma camera takes pictures continually for about an hour. You'll need to keep still during the HIDA scan. This can become uncomfortable, but you may find that you can lessen the discomfort by taking deep breaths and thinking about other things. Tell your health care team if you're uncomfortable. The radiologist will watch on a computer the progress of the radioactive tracer through your body. The HIDA scan may be stopped when the radioactive tracer is seen in the gallbladder and enters your small intestine. This typically takes about an hour. In some cases extra imaging will be performed if original images aren't satisfactory, if morphine is given to help visualize the gallbladder or if the medication CCK is given to look at the contraction of the gallbladder. This test typically takes 2 hours to complete. ________________________________________________________________________

## 2018-04-18 ENCOUNTER — Encounter: Payer: Self-pay | Admitting: Gastroenterology

## 2018-04-18 ENCOUNTER — Ambulatory Visit (AMBULATORY_SURGERY_CENTER): Payer: 59 | Admitting: Gastroenterology

## 2018-04-18 VITALS — BP 118/74 | HR 56 | Temp 98.6°F | Resp 19 | Ht 71.0 in | Wt 229.0 lb

## 2018-04-18 DIAGNOSIS — K3189 Other diseases of stomach and duodenum: Secondary | ICD-10-CM

## 2018-04-18 DIAGNOSIS — R112 Nausea with vomiting, unspecified: Secondary | ICD-10-CM

## 2018-04-18 DIAGNOSIS — K2970 Gastritis, unspecified, without bleeding: Secondary | ICD-10-CM

## 2018-04-18 MED ORDER — SODIUM CHLORIDE 0.9 % IV SOLN: 500.0000 mL | Freq: Once | INTRAVENOUS | Status: DC

## 2018-04-18 NOTE — Progress Notes (Signed)
PT taken to PACU. Monitors in place. VSS. Report given to RN. 

## 2018-04-18 NOTE — Op Note (Signed)
[facility] Patient Name: Gregory Wall Procedure Date: 04/18/2018 9:55 AM MRN: [id number] Endoscopist: Tressia Danas MD, MD Age: 33 Referring MD:  Date of Birth: 01-Apr-1985 Gender: Male Account #: [id number] Procedure:                Upper GI endoscopy Indications:              Abdominal pain, Nausea with vomiting not explained                            by ultrasound or CT scan Medicines:                See the Anesthesia note for documentation of the                            administered medications Procedure:                Pre-Anesthesia Assessment:                           - Prior to the procedure, a History and Physical                            was performed, and patient medications and                            allergies were reviewed. The patient's tolerance of                            previous anesthesia was also reviewed. The risks                            and benefits of the procedure and the sedation                            options and risks were discussed with the patient.                            All questions were answered, and informed consent                            was obtained. Prior Anticoagulants: The patient has                            taken no previous anticoagulant or antiplatelet                            agents. ASA Grade Assessment: II - A patient with                            mild systemic disease. After reviewing the risks                            and benefits, the patient was deemed in  satisfactory condition to undergo the procedure.                           After obtaining informed consent, the endoscope was                            passed under direct vision. Throughout the                            procedure, the patient's blood pressure, pulse, and                            oxygen saturations were monitored continuously. The                            Endoscope was introduced through  the mouth, and                            advanced to the third part of duodenum. The upper                            GI endoscopy was accomplished without difficulty.                            The patient tolerated the procedure well. Scope In: Scope Out: Findings:                 The esophagus was normal.                           A few localized, non-bleeding erosions were found                            in the gastric antrum. There were no stigmata of                            recent bleeding. Biopsies were taken with a cold                            forceps for histology. Estimated blood loss: none.                           The examined duodenum was normal. Biopsies were                            taken with a cold forceps for histology.                           The exam was otherwise without abnormality. Complications:            No immediate complications. Estimated Blood Loss:     Estimated blood loss: none. Impression:               - Normal esophagus.                           -  Non-bleeding erosive gastropathy. Biopsied.                           - Normal examined duodenum. Biopsied.                           - The examination was otherwise normal. Recommendation:           - Patient has a contact number available for                            emergencies. The signs and symptoms of potential                            delayed complications were discussed with the                            patient. Return to normal activities tomorrow.                            Written discharge instructions were provided to the                            patient.                           - Resume previous diet.                           - Continue present medications including                            pantoprazole (Protonix) 40 mg twice daily and                            Zofran as needed for nausea/vomiting.                           - Await pathology results.                            - No repeat upper endoscopy planned at this time.                           - Proceed with HIDA scan with CCK for further                            evaluation Tressia DanasKimberly Delisa Finck MD, MD 04/18/2018 10:14:37 AM This report has been signed electronically.

## 2018-04-18 NOTE — Progress Notes (Signed)
Called to room to assist during endoscopic procedure.  Patient ID and intended procedure confirmed with present staff. Received instructions for my participation in the procedure from the performing physician.  

## 2018-04-18 NOTE — Patient Instructions (Signed)
   CONTINUE ZOFRAN   CONTINUE PROTONIX 40 MG TWICE DAILY  PROCEED WITH HIDA SCAN AS SCHEDULED   AWAIT PATHOLOGY RESULTS OF BIOPSIES DINE TODAY   YOU HAD AN ENDOSCOPIC PROCEDURE TODAY AT THE [facility]:   Refer to the procedure report that was given to you for any specific questions about what was found during the examination.  If the procedure report does not answer your questions, please call your gastroenterologist to clarify.  If you requested that your care partner not be given the details of your procedure findings, then the procedure report has been included in a sealed envelope for you to review at your convenience later.  YOU SHOULD EXPECT: Some feelings of bloating in the abdomen. Passage of more gas than usual.  Walking can help get rid of the air that was put into your GI tract during the procedure and reduce the bloating. If you had a lower endoscopy (such as a colonoscopy or flexible sigmoidoscopy) you may notice spotting of blood in your stool or on the toilet paper. If you underwent a bowel prep for your procedure, you may not have a normal bowel movement for a few days.  Please Note:  You might notice some irritation and congestion in your nose or some drainage.  This is from the oxygen used during your procedure.  There is no need for concern and it should clear up in a day or so.  SYMPTOMS TO REPORT IMMEDIATELY:     Following upper endoscopy (EGD)  Vomiting of blood or coffee ground material  New chest pain or pain under the shoulder blades  Painful or persistently difficult swallowing  New shortness of breath  Fever of 100F or higher  Black, tarry-looking stools  For urgent or emergent issues, a gastroenterologist can be reached at any hour by calling [phone].   DIET:  We do recommend a small meal at first, but then you may proceed to your regular diet.  Drink plenty of fluids but you should avoid alcoholic beverages for 24 hours.  ACTIVITY:   You should plan to take it easy for the rest of today and you should NOT DRIVE or use heavy machinery until tomorrow (because of the sedation medicines used during the test).    FOLLOW UP: Our staff will call the number listed on your records the next business day following your procedure to check on you and address any questions or concerns that you may have regarding the information given to you following your procedure. If we do not reach you, we will leave a message.  However, if you are feeling well and you are not experiencing any problems, there is no need to return our call.  We will assume that you have returned to your regular daily activities without incident.  If any biopsies were taken you will be contacted by phone or by letter within the next 1-3 weeks.  Please call us at [phone] if you have not heard about the biopsies in 3 weeks.    SIGNATURES/CONFIDENTIALITY: You and/or your care partner have signed paperwork which will be entered into your electronic medical record.  These signatures attest to the fact that that the information above on your After Visit Summary has been reviewed and is understood.  Full responsibility of the confidentiality of this discharge information lies with you and/or your care-partner.

## 2018-04-19 ENCOUNTER — Telehealth: Payer: Self-pay

## 2018-04-19 ENCOUNTER — Other Ambulatory Visit: Payer: Self-pay

## 2018-04-19 ENCOUNTER — Encounter: Payer: Self-pay | Admitting: Gastroenterology

## 2018-04-19 DIAGNOSIS — R112 Nausea with vomiting, unspecified: Secondary | ICD-10-CM

## 2018-04-19 DIAGNOSIS — R1011 Right upper quadrant pain: Secondary | ICD-10-CM

## 2018-04-19 DIAGNOSIS — R1013 Epigastric pain: Secondary | ICD-10-CM

## 2018-04-19 NOTE — Telephone Encounter (Signed)
Patient has been scheduled for HIDA on 04/26/18 7:30 at [facility]. Left message for patient to call back

## 2018-04-19 NOTE — Telephone Encounter (Signed)
Patient's wife notified of the appt dates and time.

## 2018-04-19 NOTE — Progress Notes (Signed)
hida

## 2018-04-19 NOTE — Telephone Encounter (Signed)
  Follow up Call-  Call back number 04/18/2018  Post procedure Call Back phone  # [phone] or [phone] wife  Permission to leave phone message Yes  Some recent data might be hidden     Patient questions:  Do you have a fever, pain , or abdominal swelling? No. Pain Score  0 *  Have you tolerated food without any problems? Yes.    Have you been able to return to your normal activities? Yes.    Do you have any questions about your discharge instructions: Diet   No. Medications  No. Follow up visit  No.  Do you have questions or concerns about your Care? No.  Actions: * If pain score is 4 or above: No action needed, pain <4.

## 2018-04-20 ENCOUNTER — Telehealth: Payer: Self-pay | Admitting: Gastroenterology

## 2018-04-20 NOTE — Telephone Encounter (Signed)
Patient was scheduled for HIDA by Hyacinth Meeker, PA then by Dr. Orvan Falconer.  Patient prefers to keep appt for 04/25/18. 04/26/18 is cancelled.  Patient aware.

## 2018-04-20 NOTE — Telephone Encounter (Signed)
Pt called in to advised that he has been double book. The schd appt for 04/26/2018 pt wants to to be sure that was an error since he was originally sched for same proced. 04/25/2018

## 2018-04-24 ENCOUNTER — Other Ambulatory Visit: Payer: Self-pay

## 2018-04-24 DIAGNOSIS — R1013 Epigastric pain: Secondary | ICD-10-CM

## 2018-04-24 DIAGNOSIS — R112 Nausea with vomiting, unspecified: Secondary | ICD-10-CM

## 2018-04-24 DIAGNOSIS — R1011 Right upper quadrant pain: Secondary | ICD-10-CM

## 2018-04-25 ENCOUNTER — Other Ambulatory Visit (INDEPENDENT_AMBULATORY_CARE_PROVIDER_SITE_OTHER): Payer: 59

## 2018-04-25 ENCOUNTER — Encounter (HOSPITAL_COMMUNITY)
Admission: RE | Admit: 2018-04-25 | Discharge: 2018-04-25 | Disposition: A | Discharge status: Home / Self Care | Payer: 59 | Source: Ambulatory Visit | Attending: Physician Assistant | Admitting: Physician Assistant

## 2018-04-25 DIAGNOSIS — R1013 Epigastric pain: Secondary | ICD-10-CM

## 2018-04-25 DIAGNOSIS — R1011 Right upper quadrant pain: Secondary | ICD-10-CM | POA: Diagnosis not present

## 2018-04-25 DIAGNOSIS — R112 Nausea with vomiting, unspecified: Secondary | ICD-10-CM

## 2018-04-25 LAB — IGA: IgA: 397 mg/dL — ABNORMAL HIGH (ref 68–378)

## 2018-04-25 MED ORDER — TECHNETIUM TC 99M MEBROFENIN IV KIT
5.4000 | PACK | Freq: Once | INTRAVENOUS | Status: AC | PRN
  Administered 2018-04-25: 5.4 via INTRAVENOUS

## 2018-04-26 ENCOUNTER — Encounter (HOSPITAL_COMMUNITY): Payer: 59

## 2018-04-26 LAB — TISSUE TRANSGLUTAMINASE, IGA: (TTG) AB, IGA: 1 U/mL

## 2018-05-10 ENCOUNTER — Encounter: Payer: Self-pay | Admitting: Cardiology

## 2018-05-10 ENCOUNTER — Ambulatory Visit: Payer: 59 | Admitting: Cardiology

## 2018-05-10 VITALS — BP 126/86 | HR 84 | Ht 72.0 in | Wt 226.6 lb

## 2018-05-10 DIAGNOSIS — R002 Palpitations: Secondary | ICD-10-CM | POA: Diagnosis not present

## 2018-05-10 DIAGNOSIS — I493 Ventricular premature depolarization: Secondary | ICD-10-CM

## 2018-05-10 NOTE — Patient Instructions (Signed)
Medication Instructions:  CONTINUE TAKING  METOPROLOL If you need a refill on your cardiac medications before your next appointment, please call your pharmacy.   Lab work: Not needed If you have labs (blood work) drawn today and your tests are completely normal, you will receive your results only by: Marland Kitchen MyChart Message (if you have MyChart) OR . A paper copy in the mail If you have any lab test that is abnormal or we need to change your treatment, we will call you to review the results.  Testing/Procedures: Not needed Follow-Up: At [facility], you and your health needs are our priority.  As part of our continuing mission to provide you with exceptional heart care, we have created designated Provider Care Teams.  These Care Teams include your primary Cardiologist (physician) and Advanced Practice Providers (APPs -  Physician Assistants and Nurse Practitioners) who all work together to provide you with the care you need, when you need it. You will need a follow up appointment in 6 to 8 months AUG TO OCT 2020.  Please call our office 2 months in advance to schedule this appointment.  You may see Bryan Lemma, MD  or one of the following Advanced Practice Providers on your designated Care Team:   Theodore Demark, PA-C . Joni Reining, DNP, ANP  Any Other Special Instructions Will Be Listed Below (If Applicable).   CONTINUE  WITH EXERCISING  AND DIET CHANGE  REMEMBER TO EAT SOMETHING PRIOR TO DRINKING COFFEE.

## 2018-05-10 NOTE — Assessment & Plan Note (Signed)
Stable heart rate 84 bpm.  Is on 25 of Toprol and his palpitations seem to be better with changing his diet. I encouraged him on his results, and we talked a little bit about how the diet does affect his palpitations.

## 2018-05-10 NOTE — Progress Notes (Signed)
PCP: Gregory Lions, PA-C  Clinic Note: Chief Complaint  Patient presents with  . Follow-up    Feeling much better.  Less palpitations and but no chest pain    HPI: Gregory Wall is a 33 y.o. male with a h/o Symptomatic PVCs who presents today for ~1 month f/u after being seen in Jan for Chest Pain.Gregory Wall was last seen about a month ago for atypical chest pain symptoms that sounded more epigastric in nature.  This seem to be triggering his palpitations.  There was a suggestion from the ER doctor that he have a stress test done so we ordered a GXT.  This never got done because he had to reschedule and he now feels better.  Recent Hospitalizations: None  Studies Personally Reviewed - (if available, images/films reviewed: From Epic Chart or Care Everywhere)  None since last visit  Interval History: Gregory Wall presents here today actually doing much better than he had.  He and his wife have totally adjusted his diet and have gone to a very simple simplistic diet avoiding foods that would trigger his GERD.  It seems like his GERD is what usually triggers his palpitations.  Since doing that, he is actually feeling much better and has had much less in the way of any palpitations.  He notes that it is certain foods that trigger his GERD and that triggers palpitations, but also notes his social stress will increase it as well.  He still only has some palpitations off and on, but they are much better controlled.  Not nearly as bothersome as he has been in the past.   He is now also starting to get into an exercise regimen.  He does state that he feels better and has less palpitations once he exercises.  He is lost weight already with his new plan and is hoping that he continue to do better and feel better.   He only notes occasional palpitations but denies any chest pain or pressure with rest or exertion.  No PND, orthopnea or edema. No palpitations, lightheadedness, dizziness,  weakness or syncope/near syncope. No TIA/amaurosis fugax symptoms. No melena, hematochezia, hematuria, or epstaxis. No claudication.  ROS: A comprehensive was performed. Review of Systems  Constitutional: Negative for malaise/fatigue (Energy much better now that he is getting better exercise and sleep).  HENT: Negative for ear discharge, nosebleeds and sinus pain.   Gastrointestinal: Positive for abdominal pain, heartburn and nausea. Negative for blood in stool and melena.       Symptoms very food intake  Genitourinary: Negative for hematuria.  Musculoskeletal: Negative for falls and joint pain.  Neurological: Positive for dizziness. Negative for focal weakness.  Psychiatric/Behavioral: The patient is nervous/anxious.      I have reviewed and (if needed) personally updated the patient's problem list, medications, allergies, past medical and surgical history, social and family history.   Past Medical History:  Diagnosis Date  . Adult ADHD   . Adverse effect of anabolic steroid    Longtime use of anabolic steroids in early 20s  . Depression with anxiety   . GERD (gastroesophageal reflux disease)   . History of nephrolithiasis   . OSA on CPAP   . PTSD (post-traumatic stress disorder)   . Symptomatic PVCs    Roughly 4% PVCs noted on monitor.  Controlled with beta-blocker and avoiding triggers.    Past Surgical History:  Procedure Laterality Date  . EYE SURGERY  2001  . SHOULDER SURGERY  abscess    Current Meds  Medication Sig  . b complex vitamins tablet Take 1 tablet by mouth daily.  . metoprolol succinate (TOPROL-XL) 25 MG 24 hr tablet Take 1 tablet (25 mg total) by mouth daily. Take additional tablet daily as needed for palpitations/PVCs (Patient taking differently: Take 25 mg by mouth See admin instructions. Take one tablet daily. Take additional tablet daily as needed for palpitations/PVCs)  . Omega-3 Fatty Acids (FISH OIL PO) Take 3,000 mg by mouth daily.   Marland Kitchen OVER  THE COUNTER MEDICATION as directed. Bacopa for ADD  . pantoprazole (PROTONIX) 40 MG tablet Take 1 tablet (40 mg total) by mouth 2 (two) times daily.    No Known Allergies  Social History   Tobacco Use  . Smoking status: Never Smoker  . Smokeless tobacco: Never Used  Substance Use Topics  . Alcohol use: Yes    Alcohol/week: 4.0 standard drinks    Types: 2 Glasses of wine, 2 Cans of beer per week  . Drug use: Yes    Types: Marijuana    Comment: regulary for pain   Social History   Social History Narrative  . Not on file    family history includes Atrial fibrillation in his maternal grandmother; CVA in his maternal grandmother and maternal uncle; Colon cancer (age of onset: 84) in his paternal grandfather; Crohn's disease in his maternal uncle; Diverticulosis in his mother; Heart attack in his paternal aunt and paternal grandfather; Heart attack (age of onset: 17) in his maternal grandfather; Hernia in his mother; Hypertension in his father; Stroke in his maternal grandfather.  Wt Readings from Last 3 Encounters:  05/10/18 226 lb 9.6 oz (102.8 kg)  04/18/18 229 lb (103.9 kg)  04/17/18 229 lb (103.9 kg)    PHYSICAL EXAM BP 126/86   Pulse 84   Ht 6' (1.829 m)   Wt 226 lb 9.6 oz (102.8 kg)   SpO2 97%   BMI 30.73 kg/m  Physical Exam  Constitutional: He is oriented to person, place, and time. He appears well-developed and well-nourished. No distress.  Mildly obese  HENT:  Head: Normocephalic and atraumatic.  Mouth/Throat: No oropharyngeal exudate.  Cardiovascular: Normal rate, regular rhythm, normal heart sounds and intact distal pulses.  Really notes the GERD only after certain foods.  Not currently  Pulmonary/Chest: Breath sounds normal. No respiratory distress. He has no wheezes. He has no rales.  Abdominal: Soft. Bowel sounds are normal. He exhibits no distension. There is no abdominal tenderness. There is no rebound.  Musculoskeletal: Normal range of motion.         General: No edema.  Neurological: He is alert and oriented to person, place, and time.  Psychiatric: He has a normal mood and affect. His behavior is normal. Judgment and thought content normal.  Seems less anxious than before     Adult ECG Report n/a  Other studies Reviewed: Additional studies/ records that were reviewed today include:  Recent Labs:   Lab Results  Component Value Date   CREATININE 0.77 04/09/2018   BUN 16 04/09/2018   NA 139 04/09/2018   K 3.6 04/09/2018   CL 107 04/09/2018   CO2 22 04/09/2018   No results found for: CHOL, HDL, LDLCALC, LDLDIRECT, TRIG, CHOLHDL    ASSESSMENT / PLAN: Problem List Items Addressed This Visit    Intermittent palpitations    Stable heart rate 84 bpm.  Is on 25 of Toprol and his palpitations seem to be better with changing his  diet. I encouraged him on his results, and we talked a little bit about how the diet does affect his palpitations.      Symptomatic PVCs - Primary (Chronic)    Triggers for his PVCs seem to be GI issues and caffeine.  Recommended that he continue to stay hydrated, continue his dietary adjustment.  Drink his coffee with meals as opposed without meal. Otherwise continue current dose of Toprol and as needed as necessary.  He has not had to use any recently.          I spent a total of with the patient and chart review. >  50% of the time was spent in direct patient consultation.  Most of the time was reassuring him that he is doing well, doing the appropriate things with his diet and exercise.  Current medicines are reviewed at length with the patient today.  (+/- concerns) none The following changes have been made:  see below  Patient Instructions  Medication Instructions:  CONTINUE TAKING  METOPROLOL If you need a refill on your cardiac medications before your next appointment, please call your pharmacy.   Lab work: Not needed If you have labs (blood work) drawn today and your tests are  completely normal, you will receive your results only by: Marland Kitchen MyChart Message (if you have MyChart) OR . A paper copy in the mail If you have any lab test that is abnormal or we need to change your treatment, we will call you to review the results.  Testing/Procedures: Not needed Follow-Up: At [facility], you and your health needs are our priority.  As part of our continuing mission to provide you with exceptional heart care, we have created designated Provider Care Teams.  These Care Teams include your primary Cardiologist (physician) and Advanced Practice Providers (APPs -  Physician Assistants and Nurse Practitioners) who all work together to provide you with the care you need, when you need it. You will need a follow up appointment in 6 to 8 months AUG TO OCT 2020.  Please call our office 2 months in advance to schedule this appointment.  You may see Bryan Lemma, MD  or one of the following Advanced Practice Providers on your designated Care Team:   Theodore Demark, PA-C . Joni Reining, DNP, ANP  Any Other Special Instructions Will Be Listed Below (If Applicable).   CONTINUE  WITH EXERCISING  AND DIET CHANGE  REMEMBER TO EAT SOMETHING PRIOR TO DRINKING COFFEE.      Studies Ordered:   No orders of the defined types were placed in this encounter.     Bryan Lemma, M.D., M.S. Interventional Cardiologist   Pager # [phone] Phone # [phone] [address] [place], [place] [address]   Thank you for choosing Heartcare at [facility]!!

## 2018-05-10 NOTE — Assessment & Plan Note (Signed)
Triggers for his PVCs seem to be GI issues and caffeine.  Recommended that he continue to stay hydrated, continue his dietary adjustment.  Drink his coffee with meals as opposed without meal. Otherwise continue current dose of Toprol and as needed as necessary.  He has not had to use any recently.

## 2018-09-21 ENCOUNTER — Other Ambulatory Visit: Payer: Self-pay | Admitting: Physician Assistant

## 2019-02-19 ENCOUNTER — Other Ambulatory Visit: Payer: Self-pay | Admitting: Gastroenterology

## 2019-05-15 ENCOUNTER — Ambulatory Visit: Payer: Self-pay | Admitting: Cardiology

## 2019-05-15 NOTE — Progress Notes (Deleted)
Primary Care Provider: Bryon Lions, PA-C Cardiologist: Bryan Lemma, MD Electrophysiologist: None  Clinic Note: No chief complaint on file.    HPI:    Gregory Wall is a 34 y.o. male with a history of symptomatic PVCs below who presents today for annual follow-up at the request of Bryon Lions, PA-C.  Gregory Wall was last seen on May 10, 2018 as a 1 month follow-up for chest pain.  Be epigastric in nature.  He is doing very well without any major issues.  Had gone to simplify diet to avoid triggering GERD.  Feeling better.  He felt the GERD was triggering his palpitations.  Controlling GERD controlled his palpitations.  Continue to take 25 mg Toprol and avoid triggers including caffeine..  Recent Hospitalizations: None  Reviewed  CV studies:    The following studies were reviewed today: (if available, images/films reviewed: From Epic Chart or Care Everywhere) . None:   Interval History:   Gregory Wall   CV Review of Symptoms (Summary) {roscv:310661}  The patient {does/does not:200015} have symptoms concerning for COVID-19 infection (fever, chills, cough, or new shortness of breath).  The patient {ACTION; IS/IS WEX:93716967} practicing social distancing & Masking.    REVIEWED OF SYSTEMS   ROS Nervous/anxious  I have reviewed and (if needed) personally updated the patient's problem list, medications, allergies, past medical and surgical history, social and family history.   PAST MEDICAL HISTORY   Past Medical History:  Diagnosis Date  . Adult ADHD   . Adverse effect of anabolic steroid    Longtime use of anabolic steroids in early 20s  . Depression with anxiety   . GERD (gastroesophageal reflux disease)   . History of nephrolithiasis   . OSA on CPAP   . PTSD (post-traumatic stress disorder)   . Symptomatic PVCs    Roughly 4% PVCs noted on monitor.  Controlled with beta-blocker and avoiding triggers.    PAST SURGICAL HISTORY   Past  Surgical History:  Procedure Laterality Date  . EYE SURGERY  2001  . SHOULDER SURGERY     abscess    MEDICATIONS/ALLERGIES   No outpatient medications have been marked as taking for the 05/15/19 encounter (Appointment) with Marykay Lex, MD.    No Known Allergies  SOCIAL HISTORY/FAMILY HISTORY   Reviewed in Epic:  Pertinent findings: ***  OBJCTIVE -PE, EKG, labs   Wt Readings from Last 3 Encounters:  05/10/18 226 lb 9.6 oz (102.8 kg)  04/18/18 229 lb (103.9 kg)  04/17/18 229 lb (103.9 kg)    Physical Exam: There were no vitals taken for this visit. Physical Exam     Adult ECG Report  Rate: *** ;  Rhythm: {rhythm:17366};   Narrative Interpretation: ***  Recent Labs:  ***  No results found for: CHOL, HDL, LDLCALC, LDLDIRECT, TRIG, CHOLHDL Lab Results  Component Value Date   CREATININE 0.77 04/09/2018   BUN 16 04/09/2018   NA 139 04/09/2018   K 3.6 04/09/2018   CL 107 04/09/2018   CO2 22 04/09/2018   No results found for: TSH  ASSESSMENT/PLAN    Problem List Items Addressed This Visit    Symptomatic PVCs - Primary (Chronic)       COVID-19 Education: The signs and symptoms of COVID-19 were discussed with the patient and how to seek care for testing (follow up with PCP or arrange E-visit).   The importance of social distancing was discussed today.  I spent a total of ***minutes  with the patient. >  50% of the time was spent in direct patient consultation.  Additional time spent with chart review  / charting (studies, outside notes, etc): *** Total Time: *** min   Current medicines are reviewed at length with the patient today.  (+/- concerns) ***   Patient Instructions / Medication Changes & Studies & Tests Ordered   There are no Patient Instructions on file for this visit.   Studies Ordered:   No orders of the defined types were placed in this encounter.    Glenetta Hew, M.D., M.S. Interventional Cardiologist   Pager #  [phone] Phone # [phone] [address] [place], [place] [address]   Thank you for choosing Heartcare at [facility]!!

## 2019-05-16 ENCOUNTER — Other Ambulatory Visit: Payer: Self-pay | Admitting: Gastroenterology

## 2019-05-27 ENCOUNTER — Encounter: Payer: Self-pay | Admitting: Cardiology

## 2019-06-04 ENCOUNTER — Encounter: Payer: Self-pay | Admitting: Cardiology

## 2019-08-10 IMAGING — DX DG CHEST 2V
2 series · 2 of 2 positions shown · non-contrast
Comparison: None.

CLINICAL DATA: Heart palpitations and center chest pain/discomfort
over the past 8 months. Pt states he occasionally becomes SOB during
strong palpitations. Hx of asthma. Nonsmoker.

EXAM:
CHEST - 2 VIEW

[chest pa]
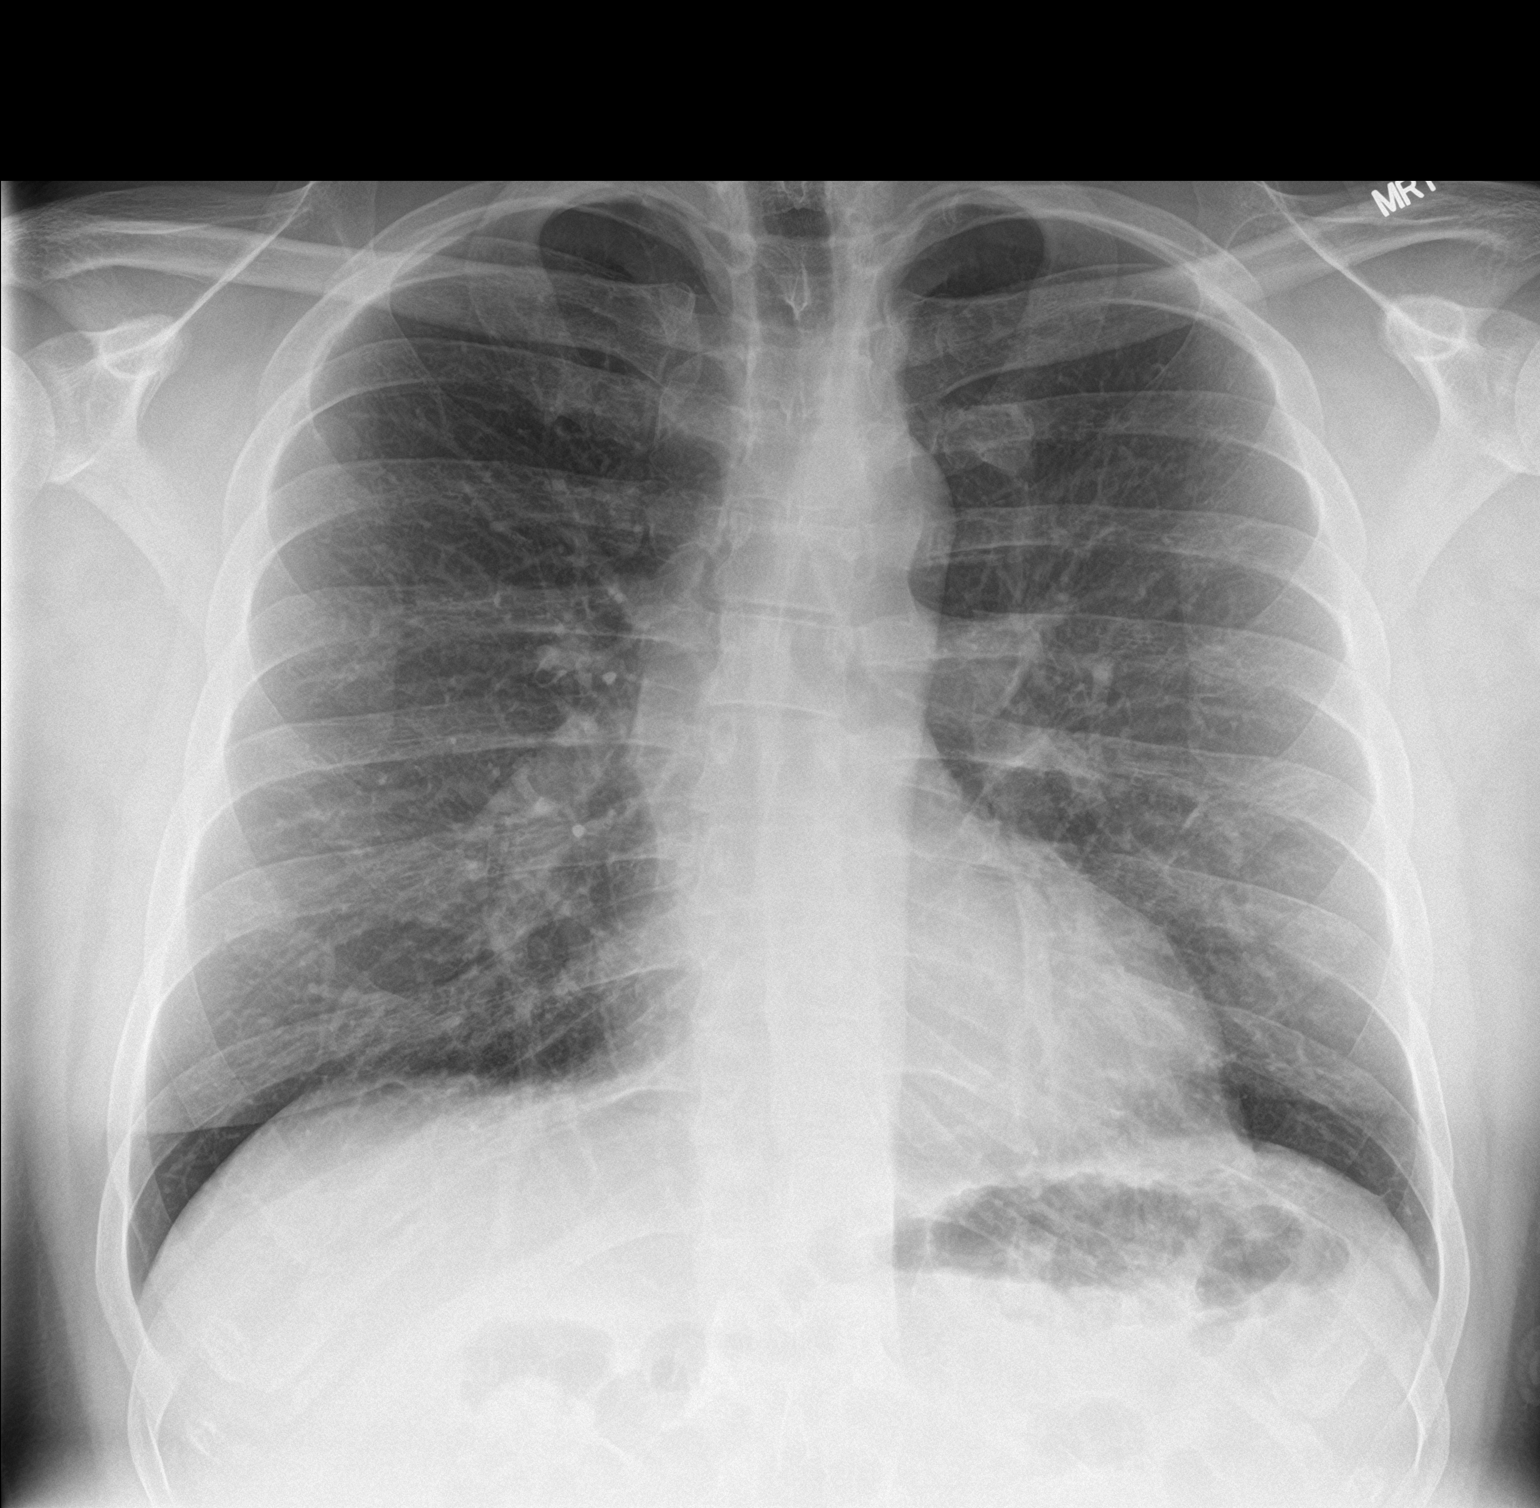

[chest lat]
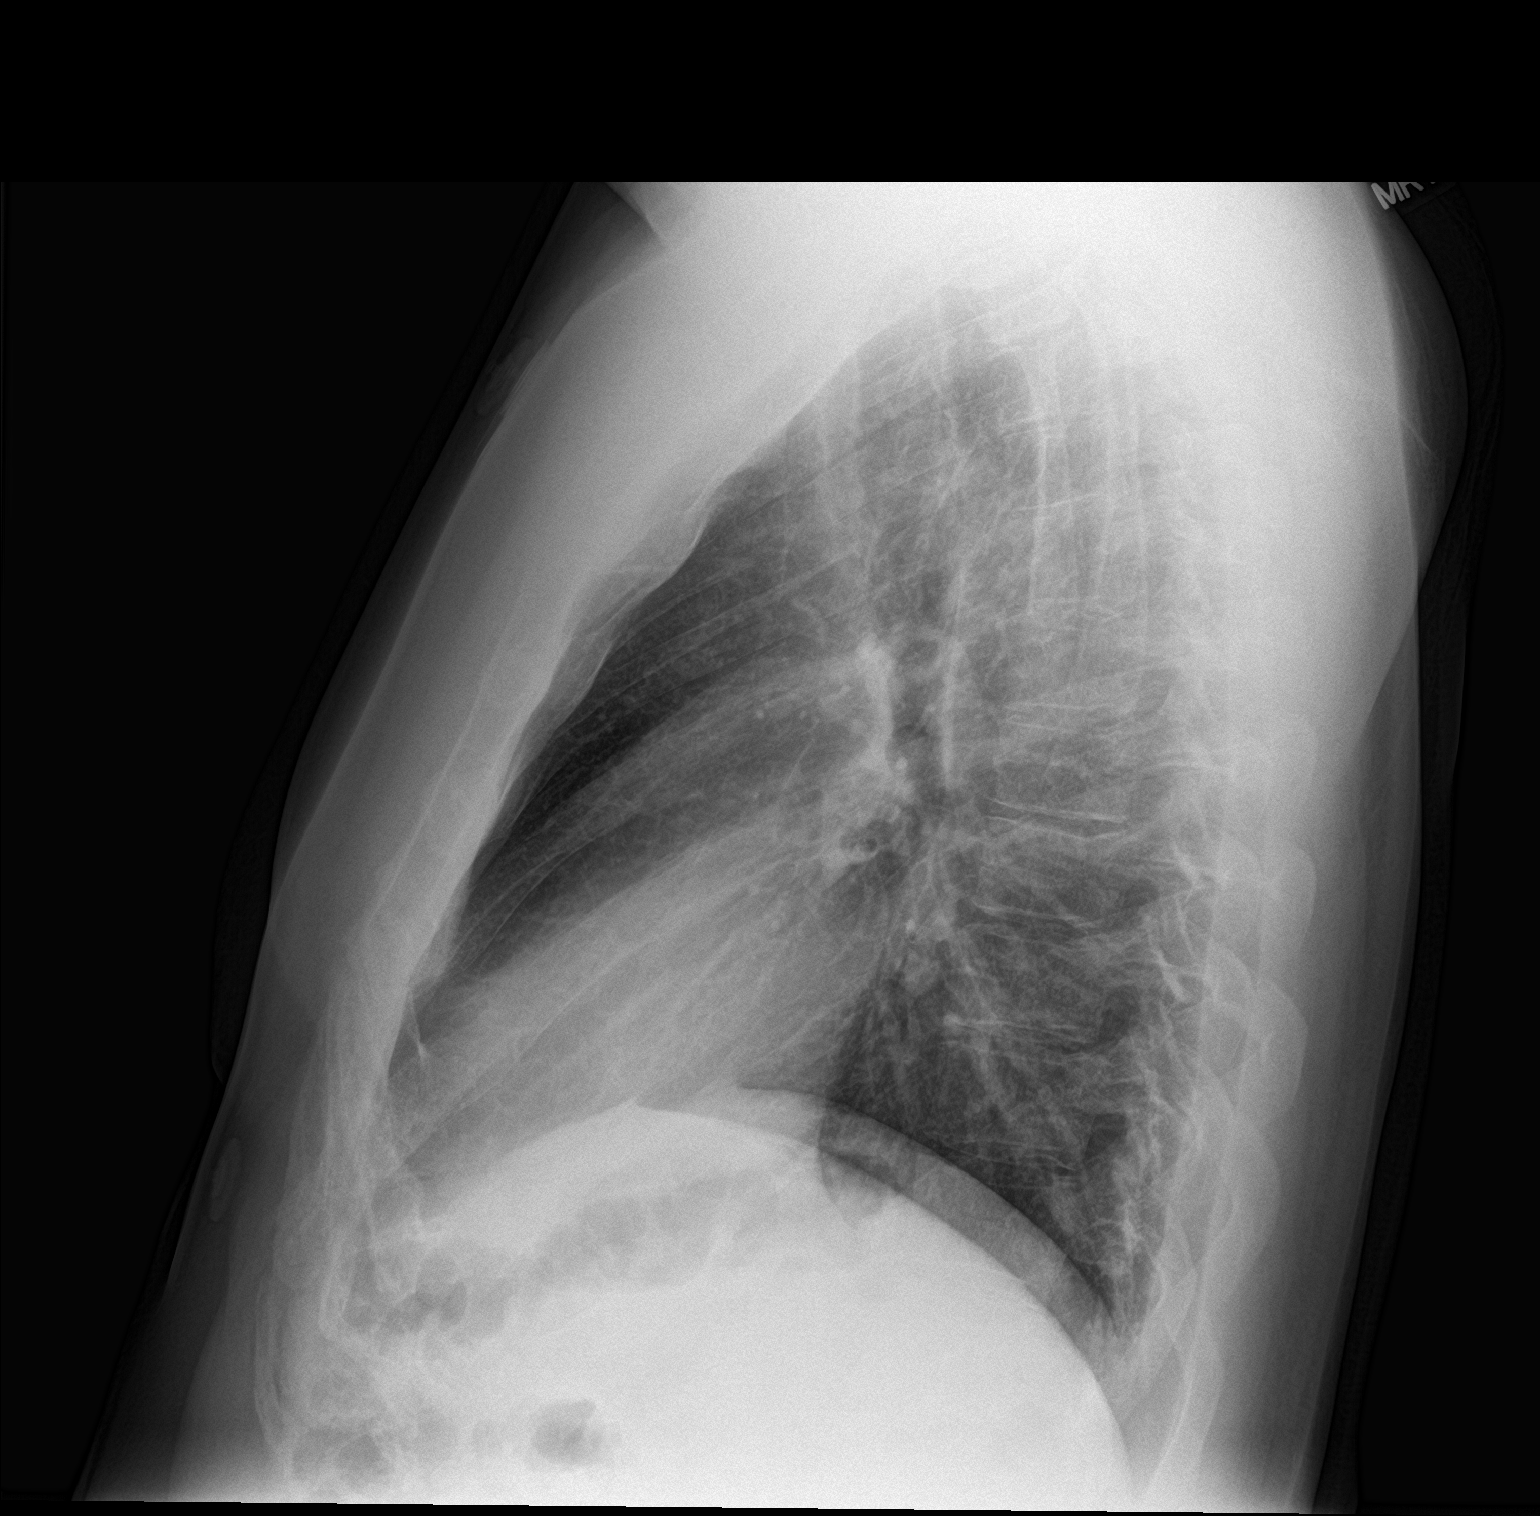

[2 of 2 positions shown; findings below may reference images not displayed]

FINDINGS: The heart size and mediastinal contours are within normal limits.
Both lungs are clear. The visualized skeletal structures are
unremarkable.
IMPRESSION: No active cardiopulmonary disease.

## 2019-11-25 ENCOUNTER — Other Ambulatory Visit: Payer: Self-pay | Admitting: Gastroenterology

## 2019-11-25 NOTE — Telephone Encounter (Signed)
May have refill until follow-up appointment with me or Hyacinth Meeker. Thanks.

## 2020-03-18 ENCOUNTER — Other Ambulatory Visit: Payer: Self-pay

## 2020-03-18 ENCOUNTER — Telehealth: Payer: Self-pay | Admitting: Gastroenterology

## 2020-03-18 DIAGNOSIS — K2100 Gastro-esophageal reflux disease with esophagitis, without bleeding: Secondary | ICD-10-CM

## 2020-03-18 MED ORDER — PANTOPRAZOLE SODIUM 40 MG PO TBEC: 40.0000 mg | DELAYED_RELEASE_TABLET | Freq: Two times a day (BID) | ORAL | 0 refills | Status: DC

## 2020-03-18 NOTE — Telephone Encounter (Signed)
Before proceeding with refill request and PA, do you want to see pt in the office to further discuss?

## 2020-03-18 NOTE — Telephone Encounter (Signed)
He may have refill until the time of scheduled follow-up with me or J Lemmon. Thanks.

## 2020-03-18 NOTE — Telephone Encounter (Signed)
Outpatient Medication Detail   Disp Refills Start End   pantoprazole (PROTONIX) 40 MG tablet 60 tablet 0 03/18/2020    Sig - Route: Take 1 tablet (40 mg total) by mouth 2 (two) times daily. MUST CALL THE OFFICE TO SCHEDULE AN APPT - Oral   Sent to pharmacy as: pantoprazole (PROTONIX) 40 MG tablet   Notes to Pharmacy: Overdue for an appt. Will provide 30 day supply. If PA required, pt will need an appt.   E-Prescribing Status: Receipt confirmed by pharmacy (03/18/2020  4:53 PM EST)    Have not seen since 2020. 30 day refill provided to allow ample time to schedule appt for f/u with Sinda Du or Dr. Orvan Falconer. If PA required, will need current appt and updated insurance info as well.

## 2020-03-24 ENCOUNTER — Encounter: Payer: Self-pay | Admitting: Gastroenterology

## 2020-03-24 ENCOUNTER — Ambulatory Visit: Payer: BC Managed Care – PPO | Admitting: Gastroenterology

## 2020-03-24 ENCOUNTER — Other Ambulatory Visit: Payer: BC Managed Care – PPO

## 2020-03-24 VITALS — BP 158/100 | HR 85 | Ht 72.0 in | Wt 239.0 lb

## 2020-03-24 DIAGNOSIS — R112 Nausea with vomiting, unspecified: Secondary | ICD-10-CM

## 2020-03-24 DIAGNOSIS — K2100 Gastro-esophageal reflux disease with esophagitis, without bleeding: Secondary | ICD-10-CM

## 2020-03-24 DIAGNOSIS — R1013 Epigastric pain: Secondary | ICD-10-CM

## 2020-03-24 DIAGNOSIS — R140 Abdominal distension (gaseous): Secondary | ICD-10-CM

## 2020-03-24 MED ORDER — ESOMEPRAZOLE MAGNESIUM 40 MG PO CPDR: 40.0000 mg | DELAYED_RELEASE_CAPSULE | Freq: Two times a day (BID) | ORAL | 3 refills | Status: DC

## 2020-03-24 MED ORDER — PANTOPRAZOLE SODIUM 40 MG PO TBEC: 40.0000 mg | DELAYED_RELEASE_TABLET | Freq: Two times a day (BID) | ORAL | 3 refills | Status: DC

## 2020-03-24 NOTE — Progress Notes (Signed)
Referring Provider: Annye English Primary Care Physician:  Loyola Mast, PA-C  Chief complaint:  Nausea, bloating   IMPRESSION:  Upper abdominal pain, nausea and vomiting with gastritis found on endoscopy    - no source identified by ultrasound and HIDA scan    - gastric erosions and duodenal lymphocytes on EGD 04/2018 H pylori negative gastritis    - failed omeprazole    - insurance denying pantoprazole Duodenal lymphocytes on EGD biopsies with normal TTGA and normal IgA Hypertension - elevated blood pressure noted today  Upper abdominal pain, nausea and vomiting may be related to gastritis. Significant symptom improvement on PPI therapy.  Unclear if marijuana is improving or worsening his symptoms. Continue PPI therapy. 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 TTGA and IgA were normal. Recommend  HLA-DQ2, DQ8 genotyping given the patient associated symptoms with consumption of gluten, although there is no weight loss or diarrhea. If either is present, will plan trial of gluten free diet for 12 months followed by repeat upper endoscopy with biopsies.     PLAN: - HLA-DQ2, DQ8 Genotyping - Switch pantoprazole 40 mg BID to Nexium 40 mg BID as required by his insurance (3 months with 3 refills) - Follow-up with PA Mellody Drown: elevated blood pressure noted today  Please see the "Patient Instructions" section for addition details about the plan.  HPI: Gregory Wall is a 35 y.o. male who returns in follow-up with abdominal pain, nausea, and vomiting. He has not seen seen since 04/2018.   Prior evaluation of his symptoms has included: - Normal CMP and lipase 04/09/18 except for glucose 120 - Abdominal ultrasound 04/02/18: echogenic liver, otherwise normal - EGD 04/18/2018 showed gastric erosions.  EGD 04/18/2018 showed gastric erosions.  Gastric biopsies showed gastritis.  There was no H. pylori.  Duodenal biopsies showed a mild increase in intraepithelial lymphocytes. -  Normal TTGA and IgA were normal. - HIDA scan 04/25/2018 was normal including a gallbladder ejection fraction of 55%.  He did experience pain with oral Ensure consumption.  Has intermittent, unpredictable nausea and vomiting that occurs primarily in the morning over the last 6+ years. Near constant epigastric pain. Frequent bloating and gas, particularly within minutes of eating. Associated palpitations.  No diarrhea or constipation.  No blood or mucous in the stool. Worse with gluten. Can no longer tolerate beer.  Pantoprazole provides relief except for 1-2 times a month.  Follow-up with cardiologist for PVCs has identified caffeine/diet as a trigger.   Develops a rash with certain Bang drinks but not with other foods. Notes that his diet is not as good since he separated from his wife.   Rare AlkaSeltzer sinus for cold.  Denies the use of other NSAIDs.   Increased stress right now.  Mother is hospitalized with congestive heart failure.  His blood pressure is elevated today but he attributes that to drinking two energy drinks this morning.   Mother with IBS. Maternal uncle with Crohn's disease. No known family history of colon cancer or polyps. No family history of uterine/endometrial cancer, pancreatic cancer or gastric/stomach cancer.   Past Medical History:  Diagnosis Date  . Adult ADHD   . Adverse effect of anabolic steroid    Longtime use of anabolic steroids in early 20s  . Depression with anxiety   . GERD (gastroesophageal reflux disease)   . History of nephrolithiasis   . OSA on CPAP   . PTSD (post-traumatic stress disorder)   . Symptomatic PVCs  Roughly 4% PVCs noted on monitor.  Controlled with beta-blocker and avoiding triggers.    Past Surgical History:  Procedure Laterality Date  . EYE SURGERY  2001  . SHOULDER SURGERY     abscess    Current Outpatient Medications  Medication Sig Dispense Refill  . b complex vitamins tablet Take 1 tablet by mouth daily.    .  busPIRone (BUSPAR) 10 MG tablet Take 10 mg by mouth 2 (two) times daily.    . Omega-3 Fatty Acids (FISH OIL PO) Take 3,000 mg by mouth daily.     . pantoprazole (PROTONIX) 40 MG tablet Take 1 tablet (40 mg total) by mouth 2 (two) times daily. MUST CALL THE OFFICE TO SCHEDULE AN APPT 60 tablet 0  . sertraline (ZOLOFT) 50 MG tablet Take 1 tablet by mouth daily.     No current facility-administered medications for this visit.    Allergies as of 03/24/2020  . (No Known Allergies)    Family History  Problem Relation Age of Onset  . Hernia Mother        severaL sGX  . Diverticulosis Mother   . Hypertension Father   . Atrial fibrillation Maternal Grandmother        s/p PPM  . CVA Maternal Grandmother   . Stroke Maternal Grandfather   . Heart attack Maternal Grandfather 65  . Heart attack Paternal Grandfather        pacemaker  . Colon cancer Paternal Grandfather 84  . Heart attack Paternal Aunt        late 63s - early 66s  . CVA Maternal Uncle   . Crohn's disease Maternal Uncle        and cousin with Crohns    Social History   Socioeconomic History  . Marital status: Married    Spouse name: Not on file  . Number of children: Not on file  . Years of education: Not on file  . Highest education level: Not on file  Occupational History  . Not on file  Tobacco Use  . Smoking status: Never Smoker  . Smokeless tobacco: Never Used  Vaping Use  . Vaping Use: Never used  Substance and Sexual Activity  . Alcohol use: Yes    Alcohol/week: 4.0 standard drinks    Types: 2 Glasses of wine, 2 Cans of beer per week  . Drug use: Yes    Types: Marijuana    Comment: regulary for pain  . Sexual activity: Not on file  Other Topics Concern  . Not on file  Social History Narrative  . Not on file   Social Determinants of Health   Financial Resource Strain: Not on file  Food Insecurity: Not on file  Transportation Needs: Not on file  Physical Activity: Not on file  Stress: Not on  file  Social Connections: Not on file  Intimate Partner Violence: Not on file    Review of Systems: 12 system ROS is negative except as noted above.   Physical Exam: General:   Alert,  well-nourished, pleasant and cooperative in NAD Head:  Normocephalic and atraumatic. Eyes:  Sclera clear, no icterus.   Conjunctiva pink. Ears:  Normal auditory acuity. Nose:  No deformity, discharge,  or lesions. Mouth:  No deformity or lesions.   Neck:  Supple; no masses or thyromegaly. Lungs:  Clear throughout to auscultation.   No wheezes. Heart:  Regular rate and rhythm; no murmurs. Abdomen:  Soft,nontender, nondistended, normal bowel sounds, no rebound or guarding. No  hepatosplenomegaly.   Rectal:  Deferred  Msk:  Symmetrical. No boney deformities LAD: No inguinal or umbilical LAD Extremities:  No clubbing or edema. Neurologic:  Alert and  oriented x4;  grossly nonfocal Skin:  Intact without significant lesions or rashes. Psych:  Alert and cooperative. Normal mood and affect.    Devonia Farro L. Tarri Glenn, MD, MPH 03/24/2020, 10:35 AM

## 2020-03-24 NOTE — Patient Instructions (Addendum)
I have recommended additional testing to evaluate for celiac: HLA-DQ2, DQ8 Genotyping  LABS: Your provider has requested that you go to the basement level for lab work before leaving today. Press "B" on the elevator. The lab is located at the first door on the left as you exit the elevator.  HEALTHCARE LAWS AND MY CHART RESULTS: Due to recent changes in healthcare laws, you may see the results of your imaging and laboratory studies on MyChart before your provider has had a chance to review them.  We understand that in some cases there may be results that are confusing or concerning to you. Not all laboratory results come back in the same time frame and the provider may be waiting for multiple results in order to interpret others.  Please give [place] 48 hours in order for your provider to thoroughly review all the results before contacting the office for clarification of your results.   Your blood pressure is very high today! Please review this with Daisy Lazar.   We submitted a Prior Authorization for Pantoprazole but this has been denied by your plan. Step Therapy of Nexium will need to be tried and failed according to your insurance plan.  PRESCRIPTION MEDICATION(S): We have sent the following medication(s) to your pharmacy:  . Nexium - please take 12m by mouth twice daily  If you are age 7621or younger, your body mass index should be between 19-25. Your Body mass index is 32.41 kg/m. If this is out of the aformentioned range listed, please consider follow up with your Primary Care Provider.   Thank you for trusting me with your gastrointestinal care!    KThornton Park MD, MPH

## 2020-03-24 NOTE — Progress Notes (Signed)
Gregory Wall (Key: [id number])  Your information has been submitted to [facility]. [employer] [place] will review the request and notify you of the determination decision directly, typically within 72 hours of receiving all information.  You will also receive your request decision electronically. To check for an update later, open this request again from your dashboard.  If [employer] [place] has not responded within the specified timeframe or if you have any questions about your PA submission, contact [employer] Cross [place] directly at [phone].  Discussed alternatives with Dr. Orvan Falconer according to pt detailed insurance plan coverage. Will change to Nexium per Dr. Orvan Falconer orders.

## 2020-04-06 LAB — CELIAC DISEASE HLA DQ ASSOC.
DQ2 (DQA1 0501/0505,DQB1 02XX): NEGATIVE
DQ8 (DQA1 03XX, DQB1 0302): NEGATIVE

## 2020-04-10 ENCOUNTER — Telehealth: Payer: Self-pay | Admitting: Gastroenterology

## 2020-04-10 NOTE — Telephone Encounter (Signed)
Inbound call from patient stating insurance is asking for a prior authorization for Nexium medication.

## 2020-04-10 NOTE — Telephone Encounter (Signed)
PRIOR AUTHORIZATION  PA initiation date: 04/11/19  Medication: Nexium Insurance Company: [employer] completed electronically through [employer] My Meds: Yes  Will await insurance response re: approval/denial.  Marton Kina (Key: [id number])  Your information has been submitted to [employer] [place]. Blue Cross [place] will review the request and notify you of the determination decision directly, typically within 72 hours of receiving all information.  You will also receive your request decision electronically. To check for an update later, open this request again from your dashboard.  If [employer] [place] has not responded within the specified timeframe or if you have any questions about your PA submission, contact Blue Cross [place] directly at [phone].  Drewey Escandon Key: [id number] - Rx #: [id number] Need help? Call us at [phone] Status Sent to [facility] Esomeprazole Magnesium 40MG  dr capsules Form Blue Form (CB) Original Claim Info 70,76 OTC ALTERNATIVE AVAILABLEMAXIMUM DAILY DOSE OF .001000 For RxLocal Coupon Price of: $128.32 submit to [id number] PCN: CP Group: [facility] --Service provided at no cost and no switch fee to the pharmacy--

## 2020-04-13 ENCOUNTER — Other Ambulatory Visit: Payer: Self-pay

## 2020-04-13 DIAGNOSIS — K2100 Gastro-esophageal reflux disease with esophagitis, without bleeding: Secondary | ICD-10-CM

## 2020-04-13 MED ORDER — ESOMEPRAZOLE MAGNESIUM 40 MG PO CPDR: 40.0000 mg | DELAYED_RELEASE_CAPSULE | Freq: Two times a day (BID) | ORAL | 3 refills | Status: DC

## 2020-04-13 NOTE — Telephone Encounter (Signed)
DENIAL  Medication: Nexium Insurance Company: [employer] PA response: DENIED Rationale: As indicated below. Appears [employer]BS has indicated this medication is available OTC.  Routing response to Dr. Orvan Falconer for review. Will await response.  Kamau Sitts (Key: [id number])  This request has received a Unfavorable outcome.  Please see letter faxed to your office for details on this adverse benefit determination.  Please note any additional information provided by [facility] at the bottom of this request.  Cillian Steinhaus Key: [id number] - Rx #: [id number] Need help? Call us at [phone] Outcome Deniedon January 28 Drug Esomeprazole Magnesium 40MG  dr capsules Form Blue Form (CB) Original Claim Info 70,76 OTC ALTERNATIVE AVAILABLEMAXIMUM DAILY DOSE OF .001000 For RxLocal Coupon Price of: $128.32 submit to BIN02-14-1983 PCN: CP Group: [employer] --Service provided at no cost and no switch fee to the pharmacy--  Routing this message to Dr. : [id number] for further instruction.

## 2020-04-13 NOTE — Telephone Encounter (Signed)
We switched to Nexium at the insurance request. He needs a PPI. May substitute with the cheapest PPI on his formulary. Thanks.

## 2020-04-13 NOTE — Telephone Encounter (Signed)
Rx has been changed to reflect pt decision to purchase OTC.

## 2020-04-13 NOTE — Telephone Encounter (Signed)
Called pt and informed him about denial, as well as BCBS response re: medication being available OTC. Advised he call his insurance company to determine WHAT is covered by his insurance based on his plan's recommended formulary. The reason for changing from Pantoprazole to Nexium was that they indicated this was a covered benefit on his pan. However, denial indicates otherwise. Verbalized acceptance and understanding. States he will just purchase OTC.

## 2020-05-18 DIAGNOSIS — I10 Essential (primary) hypertension: Secondary | ICD-10-CM | POA: Insufficient documentation

## 2020-06-05 ENCOUNTER — Other Ambulatory Visit: Payer: Self-pay

## 2020-06-08 NOTE — Progress Notes (Signed)
06/08/2020 Gregory Wall [id number] [birth year]   Chief Complaint: abdominal gas/bloat   History of Present Illness: Gregory Wall. Gregory Wall is a 35 year old male with a past medical history of anxiety, depression, OSA on cpap, GERD.  He presents to our office today for further evaluation regarding N/V and abdominal gas bloat discomfort.    He was diagnosed with Covid infection 04/02/2020.  At that time, he reported having a cough with associated waves of nausea and projectile vomiting. He was seen by his [facility] PCP 04/28/2020 with a continued productive cough and fever.  He was prescribed Doxycycline 100 mg twice daily.  He took 1 or 2 doses of the Dicyclomine and he stopped it due to worse vomiting. He was prescribed a Z-Pak which she did not take. He presented to [facility] ED on 04/30/2020 with chest pain, persistent shortness of breath, weakness, headaches with nausea and vomiting.  Labs in the ED showed a WBC 3.3.  Hemoglobin 15.2.  Hematocrit 43.1.  Platelet 91.  Sodium 136.  Potassium 3.1.  BUN 10.  Creatinine 0.88.  Total bili 0.45.  Alk phos 65.  AST 36.  ALT 20.  Chest CT angiogram was negative for PE or other acute abnormality within the chest.  Twelve-lead EKG showed nonspecific T wave abnormality now evident in anterior lateral leads with a shortened QT interval. Echo was normal.  He was discharged home on hyoscyamine, ondansetron and Phenergan.  He went back to the ED 05/04/2020 with epigastric and periumbilical pain.  Abs in the ED showed a WBC 9.0.  Hemoglobin 13.4.  Platelet 166.  Potassium 3.7.  BUN 10.  Creatinine 0.55.  Alk phos 65.  Total bili 0.66.  AST 120.  ALT 117.  Lipase 36.   An abdominal/pelvic CT scan identified scattered air-fluid levels in nondilated small bowel with mild prominent mesenteric lymph nodes suggestive of enteritis without evidence of bowel obstruction.  Distal colonic diverticulosis without evidence of diverticulitis and a tiny nonobstructing left renal  calculus was noted.  He was diagnosed with gastroenteritis and he was discharged home with the instructions to take dicyclomine 20 mg 4 times daily as needed and Phenergan 25 mg suppositories as needed.  He was seen by his PCP 05/18/2020 and Pantoprazole was increased to 40 mg twice daily he was advised to follow-up in our office for further GERD management.  His nausea resolved after pantoprazole was increased to twice daily.  No further vomiting.  He has infrequent heartburn.  No dysphagia.  Intermittent epigastric discomfort.  No lower abdominal pain.  No severe abdominal pain.  He is passing a normal formed brown bowel movement daily.  He reported having night sweats for at least 2 weeks post Covid infection.  He lost 20 pounds since he had Covid and gastroenteritis but has gained back approximately 10 pounds.  He is mostly concerned regarding abdominal gas bloat discomfort.  He awakens in the morning and feels as if his stomach is going to burst.  He is using CPAP which may be contributing to his abdominal distention complaints.  He notices more abdominal bloat if he eats foods such as pizza. He has stomach discomfort if he eats spicy foods or dairy products.  He has palpitations when he has active GERD symptoms.  CTAP contrast 05/04/2020: 1. Scattered air-fluid levels in nondilated small bowel with mildly prominent mesenteric lymph nodes could represent a nonspecific inflammatory or infectious enteritis. No bowel obstruction. 2. Distal colonic diverticulosis without  CT findings of acute diverticulitis at this time. 3. Tiny 2 mm nonobstructing lower pole left renal calculus.  Chest CT 04/30/2020: No evidence of pulmonary embolism or other acute abnormality within the chest  Echo 04/30/2020: Normal left ventricular chamber size, wall thickness, and systolic wall  motion.  . Preserved LVEF 60-65%.  . Normal diastolic function.  . No significant valvular abnormalities are noted.  . Mildly dilated  aortic root (level of the sinuses = 4.1 cm, level of the  sinuses index = 1.8 cm/m2). Consider gated CTA of the aorta or  non-contrast cardiac MRI with cine if clinically indicated.  . Pulmonary artery systolic pressure cannot be determined.  . No prior echocardiogram report is available forcomparison   [facility] HIDA 04/25/2018: Normal ejection fraction of radiotracer from the gallbladder. The patient did experience pain with the oral Ensure consumption. Cystic and common bile ducts are patent as is evidenced by visualization of gallbladder and small bowel.  RUQ sono 04/02/2018: Increase in liver echogenicity, a finding felt to represent a degree of hepatic steatosis. No focal liver lesions appreciated. Study otherwise unremarkable.  EGD 04/18/2018: - Normal esophagus. - Non-bleeding erosive gastropathy. Biopsied. - Normal examined duodenum. Biopsied. - The examination was otherwise normal. 1. Surgical [P], duodenal - BENIGN SMALL BOWEL MUCOSA WITH MILD INCREASE IN INTRAEPITHELIAL LYMPHOCYTES, SEE COMMENT. - NO DYSPLASIA OR MALIGNANCY. 2. Surgical [P], gastric antrum and gastric body - MILD CHRONIC GASTRITIS. - WARTHIN-STARRY IS NEGATIVE FOR HELICOBACTER PYLORI. - NO INTESTINAL METAPLASIA, DYSPLASIA, OR MALIGNANCY.  Current Outpatient Medications on File Prior to Visit  Medication Sig Dispense Refill  . amLODipine (NORVASC) 5 MG tablet Take by mouth.    Marland Kitchen b complex vitamins tablet Take 1 tablet by mouth daily.    . busPIRone (BUSPAR) 10 MG tablet Take 10 mg by mouth 2 (two) times daily.    Marland Kitchen dicyclomine (BENTYL) 20 MG tablet Take by mouth.    . Omega-3 Fatty Acids (FISH OIL PO) Take 3,000 mg by mouth daily.     . sertraline (ZOLOFT) 50 MG tablet Take 1 tablet by mouth daily.    . hyoscyamine (LEVSIN) 0.125 MG tablet Take by mouth every 4 (four) hours as needed. (Patient not taking: Reported on 06/09/2020)    . ondansetron (ZOFRAN) 4 MG tablet Take 4 mg by mouth every 8 (eight)  hours as needed. (Patient not taking: Reported on 06/09/2020)    . promethazine (PHENERGAN) 25 MG tablet Take 25 mg by mouth every 8 (eight) hours as needed. (Patient not taking: Reported on 06/09/2020)     No current facility-administered medications on file prior to visit.   Allergies  Allergen Reactions  . Doxycycline Hyclate Nausea And Vomiting    Nausea and vomiting and restless leg symptoms  . Z-Pak [Azithromycin]     Other reaction(s): Other "beacause of my heart"   Current Medications, Allergies, Past Medical History, Past Surgical History, Family History and Social History were reviewed in [employer] record.  Review of Systems:   Constitutional: Negative for fever, sweats, chills or weight loss.  Respiratory: Negative for shortness of breath.   Cardiovascular: Negative for chest pain, palpitations and leg swelling.  Gastrointestinal: See HPI.  Musculoskeletal: Negative for back pain or muscle aches.  Neurological: Negative for dizziness, headaches or paresthesias.   Physical Exam: BP 114/74 (BP Location: Left Arm, Patient Position: Sitting, Cuff Size: Normal)   Pulse 72   Ht 5' 11"  (1.803 m) Comment: height measured without shoes  Wt  235 lb 2 oz (106.7 kg)   BMI 32.79 kg/m   General: Well developed 35 year old male in no acute distress. Head: Normocephalic and atraumatic. Eyes: No scleral icterus. Conjunctiva pink . Ears: Normal auditory acuity. Mouth: Dentition intact. No ulcers or lesions.  Lungs: Clear throughout to auscultation. Heart: Regular rate and rhythm, no murmur. Abdomen: Soft,  nondistended. Mild epigastric and RUQ tenderness. No masses or hepatomegaly. Normal bowel sounds x 4 quadrants.  Rectal: Deferred.  Musculoskeletal: Symmetrical with no gross deformities. Extremities: No edema. Neurological: Alert oriented x 4. No focal deficits.  Psychological: Alert and cooperative. Normal mood and affect  Assessment and  Recommendations:  32. 35 year old male with N/V/D, likely infectious gastroenteritis which resolved. CTAP 05/04/2020 identified scattered air-fluid levels in nondilated small bowel with mildly prominent mesenteric lymph nodes, most likely nonspecific inflammatory or infectious enteritis without obstruction. Diverticulosis without evidence of diverticulitis.  -Patient to call our office if nausea/vomiting/diarrhea recurs  2. Abdominal gas/bloat -SIBO breath test  -Gas X 1 po bid   3. GERD. EGD 04/18/2018 showed mild gastritis.  Duodenal biopsies with mild increase in intraepithelial lymphocytosis with negative celiac serology. -Continue Pantoprazole 40 mg p.o. twice daily for now -Reduce gluten intake   4.  Sleep apnea uses CPAP -Follow-up with provider who manages sleep apnea, check sleep apnea device for appropriate fitting  Patient to follow-up in the office in 6 weeks

## 2020-06-09 ENCOUNTER — Ambulatory Visit (INDEPENDENT_AMBULATORY_CARE_PROVIDER_SITE_OTHER): Payer: BC Managed Care – PPO | Admitting: Nurse Practitioner

## 2020-06-09 ENCOUNTER — Encounter: Payer: Self-pay | Admitting: Nurse Practitioner

## 2020-06-09 VITALS — BP 114/74 | HR 72 | Ht 71.0 in | Wt 235.1 lb

## 2020-06-09 DIAGNOSIS — R112 Nausea with vomiting, unspecified: Secondary | ICD-10-CM

## 2020-06-09 DIAGNOSIS — K2100 Gastro-esophageal reflux disease with esophagitis, without bleeding: Secondary | ICD-10-CM

## 2020-06-09 DIAGNOSIS — R140 Abdominal distension (gaseous): Secondary | ICD-10-CM

## 2020-06-09 MED ORDER — PANTOPRAZOLE SODIUM 40 MG PO TBEC: 40.0000 mg | DELAYED_RELEASE_TABLET | Freq: Two times a day (BID) | ORAL | 1 refills | Status: DC

## 2020-06-09 NOTE — Patient Instructions (Addendum)
If you are age 35 or younger, your body mass index should be between 19-25. Your Body mass index is 32.79 kg/m. If this is out of the aformentioned range listed, please consider follow up with your Primary Care Provider.   You have been given a testing kit to check for small intestine bacterial overgrowth (SIBO) which is completed by a company named [facility].   Make sure to return your test in the mail using the return mailing label given to you along with the kit. Your demographic and insurance information have already been sent to the company and they should be in contact with you over the next week regarding this test.   [facility] will collect an upfront charge of $99.74 for commercial insurance plans and $209.74 is you are paying cash. Make sure to discuss with [facility] PRIOR to having the test if they have gotten informatoin from your insurance company as to how much your testing will cost out of pocket, if any. Please keep in mind that you will be getting a call from phone number [phone] or a similar number. If you do not hear from them within this time frame, please call our office at [phone].   Increase Pantoprazole to 40 MG twice a day. We have sent in a refill to your pharmacy. Take Gas-X 1 tablet twice a day as needed.  Follow up with who manages your CPAP fitting.  We have scheduled you a follow up with Dr. Tarri Glenn on 07/21/20 at 3:40.  It was great seeing you today! Thank you for entrusting me with your care and choosing [facility].  Noralyn Pick, CRNP

## 2020-06-09 NOTE — Progress Notes (Signed)
Reviewed.  Chelli Yerkes L. Zacari Stiff, MD, MPH  

## 2020-07-21 ENCOUNTER — Ambulatory Visit: Payer: BC Managed Care – PPO | Admitting: Gastroenterology

## 2020-09-23 ENCOUNTER — Telehealth: Payer: Self-pay | Admitting: Nurse Practitioner

## 2020-09-23 MED ORDER — PANTOPRAZOLE SODIUM 40 MG PO TBEC: 40.0000 mg | DELAYED_RELEASE_TABLET | Freq: Two times a day (BID) | ORAL | 0 refills | Status: AC

## 2020-09-23 NOTE — Telephone Encounter (Signed)
RX sent

## 2020-09-23 NOTE — Telephone Encounter (Signed)
Patient requesting refill on Protonix
